# Patient Record
Sex: Male | Born: 1950
Health system: Southern US, Community
[De-identification: ages and names within clinical notes are randomized; demographics above are authoritative.]

## PROBLEM LIST (undated history)

## (undated) DIAGNOSIS — M109 Gout, unspecified: Secondary | ICD-10-CM

## (undated) DIAGNOSIS — M1990 Unspecified osteoarthritis, unspecified site: Secondary | ICD-10-CM

## (undated) DIAGNOSIS — I10 Essential (primary) hypertension: Secondary | ICD-10-CM

## (undated) HISTORY — DX: Unspecified osteoarthritis, unspecified site: M19.90

## (undated) HISTORY — PX: KNEE SURGERY: SHX244

## (undated) HISTORY — DX: Gout, unspecified: M10.9

---

## 2002-04-09 HISTORY — PX: COLONOSCOPY: SHX174

## 2002-10-05 ENCOUNTER — Emergency Department (HOSPITAL_COMMUNITY): Admission: EM | Admit: 2002-10-05 | Discharge: 2002-10-05 | Payer: Self-pay | Admitting: Emergency Medicine

## 2002-10-10 ENCOUNTER — Emergency Department (HOSPITAL_COMMUNITY): Admission: EM | Admit: 2002-10-10 | Discharge: 2002-10-10 | Payer: Self-pay | Admitting: Emergency Medicine

## 2012-04-24 ENCOUNTER — Ambulatory Visit
Admission: RE | Admit: 2012-04-24 | Discharge: 2012-04-24 | Disposition: A | Discharge status: Home / Self Care | Payer: 59 | Source: Ambulatory Visit | Attending: Family Medicine | Admitting: Family Medicine

## 2012-04-24 ENCOUNTER — Ambulatory Visit (INDEPENDENT_AMBULATORY_CARE_PROVIDER_SITE_OTHER): Payer: 59 | Admitting: Sports Medicine

## 2012-04-24 VITALS — BP 120/70 | Ht 71.0 in | Wt 235.0 lb

## 2012-04-24 DIAGNOSIS — M25552 Pain in left hip: Secondary | ICD-10-CM

## 2012-04-24 DIAGNOSIS — M25559 Pain in unspecified hip: Secondary | ICD-10-CM

## 2012-04-24 DIAGNOSIS — S7410XA Injury of femoral nerve at hip and thigh level, unspecified leg, initial encounter: Secondary | ICD-10-CM

## 2012-04-24 MED ORDER — GABAPENTIN 300 MG PO CAPS: 300.0000 mg | ORAL_CAPSULE | Freq: Three times a day (TID) | ORAL | Status: DC

## 2012-04-24 MED ORDER — TRAMADOL HCL 50 MG PO TABS: 50.0000 mg | ORAL_TABLET | Freq: Four times a day (QID) | ORAL | Status: DC | PRN

## 2012-04-24 NOTE — Patient Instructions (Addendum)
X-rays did not show any arthritis  Ultrasound showed meralgia paresthetica  Please start gabapentin 300 at bedtime for 3 days, then take twice daily for 3 days, then increase to 3 times daily  Take vitamin B6 50 mg twice daily  Do hip exercises daily, but decrease intensity if they cause pain  Please follow up in 3 weeks

## 2012-04-24 NOTE — Assessment & Plan Note (Signed)
We will begin him on treatment with gabapentin and bill to 3 times a day.  At the end of 3 weeks I would like to recheck this and repeat the scan of his femoral nerve.  He should hold off on vigorous exercise until we get control of his pain and weakness.  He can use of tramadol to supplement the gabapentin and we'll also strart vitamin B6

## 2012-04-24 NOTE — Progress Notes (Signed)
Timothy Moyer is a 61 y.o. male who presents to [facility] today for 3 months of left groin pain. Gradual onset and worsening. Patient denies any specific injury. However on Monday they were jumping up on steps that were 27 inches high. That night he came down with a lot of knee pain and he had pain in the left hip area. This was just before the left groin pain became progressive.  He has a pertinent history for a left partial knee replacement approximately 2 years ago. Since his rehabilitation from his knee replacement he has been doing Education administrator with a Systems analyst. He is noted in the past 3 months or so he has had worsening left groin pain especially with his exercise. It is worsened to the point now her his activities of daily living are becoming impaired. For example he has difficulty putting his shoe on and getting out of bed and out of a car. His pain is worse to the extent that he is taking old hydrocodone from his knee replacement to sleep at night.  He would like to exercise 4 days a week but he is unable to secondary to his pain. He is worried he may have a muscle strain.  He notes the pain is in his left groin nonradiating and worse with range of motion. He denies any weakness numbness radiating pain fevers or chills.   He was seen by Tyrone Sage for massage to get rid of spasm in his left quadriceps. This helped.  However he periodically has sharp spasms and fasciculations in his left quadriceps and he gets a sharp burning pain there as well.   PMH reviewed. Left partial knee replacement History  Substance Use Topics  . Smoking status: Not on file  . Smokeless tobacco: Not on file  . Alcohol Use: Not on file   ROS as above otherwise neg   Exam:  BP 120/70  Ht 5\' 11"  (1.803 m)  Wt 235 lb (106.595 kg)  BMI 32.78 kg/m2 Gen: Well NAD MSK: Left hip: Normal-appearing nontender.  Range of motion of the left hip is limited to 10 internal rotation 35 external rotation.  His flexion  is 110.  He has pain with resisted flexion adduction, but pain-free abduction.  Left adduction is very weak so that he has trouble holding the leg in without resistance in an adducted position against gravity.  His right hip as a rotational range of motion of 65 and normal flexion range of motion.  No pain or tenderness. Normal strength.   Back: Nontender over spinal midline.   Leg length: No leg length discrepancy  Gait: Mild Trendelenburg gait.   X-rays of the pelvis These reveal only mild arthritic change and the left is minimally different from the right.  Ultrasound of the hip Abductor tendon is intact. Attachment to the symphysis there is some hypoechoic change and edema around the insertion. Hip flexor appears intact Femoral nerve is markedly enlarged and has significant hypoechoic change circling the femoral nerve all noted on transverse scans. On longitudinal scan there is a nodular thickening with partial disruption of the nerve.

## 2012-04-24 NOTE — Assessment & Plan Note (Addendum)
Concerning for osteoarthritis involving the left hip.  So we obtained a two-view hip to assess osteoarthritis.   This was unremarkable so we brought him back for [place]. Additionally put a small lift in his left shoe to allow for a more normal gait.  Tramadol for pain.   Will also have him do some easy exercises to maintain strength around the hip while we treat his nerve injury.

## 2012-04-24 NOTE — Addendum Note (Signed)
Addended by: Jacki Cones C on: 04/24/2012 03:32 PM   Modules accepted: Orders

## 2012-05-15 ENCOUNTER — Ambulatory Visit (INDEPENDENT_AMBULATORY_CARE_PROVIDER_SITE_OTHER): Payer: 59 | Admitting: Sports Medicine

## 2012-05-15 ENCOUNTER — Encounter: Payer: Self-pay | Admitting: Sports Medicine

## 2012-05-15 VITALS — BP 139/105 | HR 90 | Ht 71.0 in | Wt 235.0 lb

## 2012-05-15 DIAGNOSIS — M25559 Pain in unspecified hip: Secondary | ICD-10-CM

## 2012-05-15 DIAGNOSIS — M25552 Pain in left hip: Secondary | ICD-10-CM

## 2012-05-15 DIAGNOSIS — S7410XA Injury of femoral nerve at hip and thigh level, unspecified leg, initial encounter: Secondary | ICD-10-CM

## 2012-05-15 NOTE — Assessment & Plan Note (Signed)
Symptomatic improvement (about 50% compared to 3 weeks ago) and marked improvement of swelling around femoral nerve on ultrasound.  -Start hip strengthening exercises and build over next 2 weeks (see AVS for details) -Be judicious but try activities as tolerated to help with his overall physical deconditioning -Follow-up in 4 weeks

## 2012-05-15 NOTE — Progress Notes (Signed)
  Subjective:    Patient ID: Timothy Moyer, male    DOB: 1950-05-07, 62 y.o.   MRN: [id number]  HPI Follow-up of left groin pain. Diagnosed with left femoral nerve injury 01/16 after he presented initially with 3 months of groin pain.   Since his last visit, he is 50% better. He no longer has sharp shooting pain his groin area. He does have pain with certain flexion movements.  He takes gabapentin 2-3 times a day. He thinks it helps the pain but sometimes makes him feel "foggy".    He continues to have left lateral hip pain that is worse when he lies on that side. It is about the same as it was at his last visit.   Compared to last time he can now lift hip from seated position and can do voluntary adduction - both were painful and weak on last visit  Review of Systems  Allergies, medication, past medical history reviewed.  Smoking status noted.  History of left partial knee replacement    Objective:   Physical Exam GEN: NAD HIP:    ROM: internal rotation 20 deg, external rotation 35 deg (similar on both sides)   Hip flexion: 4-5/5 bilaterally; good passive hip flexion ROM (able to lift knees to chest)   Hip adduction: 4/5 bilaterally   Hip abduction: slightly weaker on left side compared to right, about 4-5/5   Mild-moderate tenderness along left trochanteric bursa  MSK ultrasound left groin: Markedly decreased hypoechoic change circling the femoral nerve.  Persistent nodular thickening on longitudinal scan. Of femoral nerve On transverse scan the nerve is thickened but less hypoechoic change within fibers     Assessment & Plan:

## 2012-05-15 NOTE — Assessment & Plan Note (Signed)
Persistent left lateral hip pain. He may have mild trochanteric bursitis. Recommending icing and hip rolls; NSAIDs as needed but use sparingly. If this worsens, advised he follow-up for trial of glucocorticoid injection.

## 2012-05-15 NOTE — Patient Instructions (Addendum)
Do hip strengthening exercises  Hip adduction/abduction  Standing rotation  In 1-2 weeks if you are feeling stronger try    Step-ups    Crossovers  Continue gabapentin up to 3 times daily   For your hip pain: -Ice -Try hip roller -Advil/ibuprofen as needed -If it gets worse, we may consider injecting it  Follow-up in 4 weeks

## 2012-05-20 ENCOUNTER — Other Ambulatory Visit: Payer: Self-pay | Admitting: Sports Medicine

## 2012-05-26 ENCOUNTER — Telehealth: Payer: Self-pay | Admitting: *Deleted

## 2012-05-26 ENCOUNTER — Other Ambulatory Visit: Payer: Self-pay | Admitting: *Deleted

## 2012-05-26 NOTE — Telephone Encounter (Signed)
Per Dr. Darrick Penna- he would like pt to increase gabapentin to 600 mg qhs and tramadol QID.

## 2012-05-26 NOTE — Telephone Encounter (Signed)
Message copied by Mora Bellman on Mon May 26, 2012 11:40 AM ------      Message from: CERESI, Shawna Orleans L      Created: Mon May 26, 2012  9:34 AM      Regarding: phone message      Contact: [phone]       Pt called regarding hip pain, he would like pain meds sent in due to getting no sleep.  ------

## 2012-05-26 NOTE — Telephone Encounter (Signed)
Left pt a VM to return my call  

## 2012-06-12 ENCOUNTER — Ambulatory Visit: Payer: 59 | Admitting: Sports Medicine

## 2012-06-17 ENCOUNTER — Encounter: Payer: Self-pay | Admitting: Sports Medicine

## 2012-06-17 ENCOUNTER — Ambulatory Visit (INDEPENDENT_AMBULATORY_CARE_PROVIDER_SITE_OTHER): Payer: 59 | Admitting: Sports Medicine

## 2012-06-17 VITALS — BP 145/94 | HR 77 | Ht 71.0 in | Wt 235.0 lb

## 2012-06-17 DIAGNOSIS — S7412XD Injury of femoral nerve at hip and thigh level, left leg, subsequent encounter: Secondary | ICD-10-CM

## 2012-06-17 DIAGNOSIS — Z5189 Encounter for other specified aftercare: Secondary | ICD-10-CM

## 2012-06-17 DIAGNOSIS — M25559 Pain in unspecified hip: Secondary | ICD-10-CM

## 2012-06-17 DIAGNOSIS — M25552 Pain in left hip: Secondary | ICD-10-CM

## 2012-06-17 NOTE — Assessment & Plan Note (Signed)
Pain is much less and seems to be controlled pretty well with using 2-3 tramadol a day

## 2012-06-17 NOTE — Progress Notes (Signed)
Patient ID: Timothy Moyer, male   DOB: 1950/04/23, 62 y.o.   MRN: [id number]  Left fem N injury At least 60% better Able to go up steps Ride stationary bike easily  Worse after sitting awhile Loosens up quickly with stretching and motion  Tramadol 2 to 3 per day  Gabapentin - now at 300/300/ 300 and 600HS  Timothy Moyer is walking better and starting to begin some easy exercises  Physical examination  No acute distress  Hip range of motion is normal but slightly tighter on the left than the right  Hip flexion is strong Hip abduction strong Sartorius testing strong Hip adduction strong but does cause some slight pain  Timothy Moyer is now able to step up on a six-inch step without any instability  Ultrasound examination  The femoral nerve on the left is visualized and the fluid surgical that surrounded this has resolved On longitudinal view the thickened portion of the nerve that looked like a split neuroma is now essentially the same with this the rest of the nerve Overall appearance looks much improved and almost the same as on the right fem N

## 2012-06-17 NOTE — Assessment & Plan Note (Signed)
Nerve injury appears to be healing  We will keep up his gabapentin  Gradually increase his activity level including starting some exercises for hip strength  Recheck in 6 weeks

## 2012-06-17 NOTE — Patient Instructions (Addendum)
Start easy hip exercises  Start easy stationary bike  All the upper body you want with your trunk supported  Isometric abs  Recheck in 6 weeks  Keep medications same or even cut back a little on the gabapentin

## 2012-06-19 ENCOUNTER — Other Ambulatory Visit: Payer: Self-pay | Admitting: Sports Medicine

## 2012-07-18 ENCOUNTER — Other Ambulatory Visit: Payer: Self-pay | Admitting: *Deleted

## 2012-07-18 MED ORDER — TRAMADOL HCL 50 MG PO TABS: 50.0000 mg | ORAL_TABLET | Freq: Four times a day (QID) | ORAL | Status: DC | PRN

## 2012-07-21 ENCOUNTER — Other Ambulatory Visit: Payer: Self-pay | Admitting: *Deleted

## 2012-07-21 MED ORDER — TRAMADOL HCL 50 MG PO TABS: 50.0000 mg | ORAL_TABLET | Freq: Four times a day (QID) | ORAL | Status: DC | PRN

## 2012-07-29 ENCOUNTER — Ambulatory Visit (INDEPENDENT_AMBULATORY_CARE_PROVIDER_SITE_OTHER): Payer: 59 | Admitting: Sports Medicine

## 2012-07-29 VITALS — BP 128/90 | Ht 71.0 in | Wt 230.0 lb

## 2012-07-29 DIAGNOSIS — IMO0002 Reserved for concepts with insufficient information to code with codable children: Secondary | ICD-10-CM

## 2012-07-29 DIAGNOSIS — S7412XD Injury of femoral nerve at hip and thigh level, left leg, subsequent encounter: Secondary | ICD-10-CM

## 2012-07-29 DIAGNOSIS — M1711 Unilateral primary osteoarthritis, right knee: Secondary | ICD-10-CM | POA: Insufficient documentation

## 2012-07-29 DIAGNOSIS — Z5189 Encounter for other specified aftercare: Secondary | ICD-10-CM

## 2012-07-29 DIAGNOSIS — M1710 Unilateral primary osteoarthritis, unspecified knee: Secondary | ICD-10-CM

## 2012-07-29 NOTE — Assessment & Plan Note (Signed)
This is much improved and we will leave him at the nighttime gabapentin and tramadol dose

## 2012-07-29 NOTE — Progress Notes (Signed)
Patient ID: Timothy Moyer, male   DOB: Jul 04, 1950, 62 y.o.   MRN: [id number]  Left groin pain f femoral N injury is much less Still hurts with ER stretch Weaned down gabapentin and pain returned Now takes 2 gabapentin and 2 tramadol just at night and does well Also takes 3 advil in AM  Both knees now hurting No swelling Has partial KR on left by Dr Randall An Now with more pain on RT lateral Xrays showed advanced DJD of knees 3 years ago  Exam  NAD  RT knee has - 5 extension Flexion to 135 Some DJD changes Warmth in SP pouch  LT knee midline scar No effusion or warmth looseness on horizontal motion  Ultrasound RT Knee There is a large effusion in the right suprapatellar pouch and there is none present on the left His right lateral meniscus is thin and calcified with very limited joint space over lateral knee Med Meniscus looks okay Quadriceps tendon and patellar tendon are intact He has mild swelling in the infrapatellar bursa at the tibial tubercle

## 2012-07-29 NOTE — Patient Instructions (Addendum)
Wear compression sleeve for a few hours at a time particularly for standing and walking  Start back 3 days per week or more; Week 1 Recumbent bike 15 minutes and then do the following Straight leg lift Limited bent leg lift Lateral leg lift  Week 2 Recumbent bike up to 15 mins Then exercises Then 10 minutes  Week 3 Recumbent 15 mins Exercises Recumbent 15 mins  Then increase as tolerated  If knee is good - keep up exercises If not consider injection by june

## 2012-07-29 NOTE — Assessment & Plan Note (Signed)
I started him back on an exercise program  We will use a compression sleeve to try to get rid of the swelling  He is a semirecumbent bike  Recheck in about 6 weeks

## 2012-08-12 ENCOUNTER — Other Ambulatory Visit: Payer: Self-pay | Admitting: *Deleted

## 2012-08-12 MED ORDER — GABAPENTIN 300 MG PO CAPS: ORAL_CAPSULE | ORAL | Status: DC

## 2012-08-12 MED ORDER — TRAMADOL HCL 50 MG PO TABS: 50.0000 mg | ORAL_TABLET | Freq: Four times a day (QID) | ORAL | Status: DC | PRN

## 2012-09-23 ENCOUNTER — Ambulatory Visit: Payer: 59 | Admitting: Sports Medicine

## 2012-09-23 ENCOUNTER — Encounter: Payer: Self-pay | Admitting: Sports Medicine

## 2012-09-23 ENCOUNTER — Ambulatory Visit (INDEPENDENT_AMBULATORY_CARE_PROVIDER_SITE_OTHER): Payer: 59 | Admitting: Sports Medicine

## 2012-09-23 VITALS — BP 145/98 | HR 68 | Ht 71.0 in | Wt 230.0 lb

## 2012-09-23 DIAGNOSIS — IMO0002 Reserved for concepts with insufficient information to code with codable children: Secondary | ICD-10-CM

## 2012-09-23 DIAGNOSIS — M1711 Unilateral primary osteoarthritis, right knee: Secondary | ICD-10-CM

## 2012-09-23 DIAGNOSIS — Z5189 Encounter for other specified aftercare: Secondary | ICD-10-CM

## 2012-09-23 DIAGNOSIS — M1710 Unilateral primary osteoarthritis, unspecified knee: Secondary | ICD-10-CM

## 2012-09-23 DIAGNOSIS — S7412XD Injury of femoral nerve at hip and thigh level, left leg, subsequent encounter: Secondary | ICD-10-CM

## 2012-09-23 MED ORDER — DICLOFENAC SODIUM 75 MG PO TBEC: 75.0000 mg | DELAYED_RELEASE_TABLET | Freq: Two times a day (BID) | ORAL | Status: DC

## 2012-09-23 NOTE — Progress Notes (Signed)
Subjective:    Patient ID: Timothy Moyer, male    DOB: 08-07-1950, 62 y.o.   MRN: [id number]  HPI 62 yo male who presents for follow up on left femoral nerve injury and right knee pain.  - left femoral nerve injury: still has occasional pain in groin with certain movements. Pain is sharp shooting. 80% improved since initial injury in January. Denies any numbness, tingling or weakness. He had weaned himself off of the gabapentin, but once off of it, had had some recurrence of pain. He is now taking gabapentin 600mg  at night which helps.   - right knee pain: had been evaluated in April for knee pain thought to be from osteoarthritis.   Pain is located in lateral aspect of knee, it is worst with standing after sitting for a long period of time. Once he is up for a few minutes, he can start walking with less pain. He uses the recumbent bike for exercise without significant pain. Denies any locking or weakness in the knee. He does hear some popping and does notice some swelling.  Has been on tramadol at night, doing strengthening exercises and wearing the compression sleeve with exercise.He states that the knee pain has improved with these measures.  He has a trip to [place] on July 8th and would like to be active during the trip. He wonders if he could get joint injection. He has had them before but never in the right knee.   - trouble sleeping: has been having difficulty sleeping and has been taking over the counter sleep aids which have helped.   Review of Systems Negative except per HPI    Objective:   Physical Exam Filed Vitals:   09/23/12 0934  BP: 145/98  Pulse: 68   General: no acute distress, pleasant gentleman Right knee: suprapatellar joint effusion palpable, tenderness along right lateral aspect of patella, normal knee extension and flexion Normal [place]  Left hip:  5/5 strength with hip flexion but mild discomfort on left with flexion, 5/5 strength with abduction bilaterally,  4+/5 strength with adduction in left hip compared to 5/5 on right.  Discomfort illicited with internal rotation of hip, otherwise normal range of motion with internal and external rotation of the left hip.   Musculoskeletal Ultrasound:  Right knee:  Infrapatellar effusion with calcifications. 6.9 cm effusion in quad Medial meniscus: spurring with calcification Lateral meniscus: degenerative changes with thinning and calcifications.  Spur on Lateral aspect of patella   Left thigh:  Fibrotic changes of femoral nerve, without significiant hypoechoic changes that could previously be seen. 0.97cm in diameter     Assessment & Plan:  62 yo male with left hip pain from a femoral nerve injury, now improving with exercises and gabapentin.  His knee pain appears to be from combination of osteoarthritic changes as well as possible gout. The presence of multiple effusions in knee with calcifications on ultrasound is suspicious for crystal formation.   - Left femoral nerve injury:   continue gabapentin 600mg  at night time.   Take tramadol during the day, as this may be  contributing to patient's difficulty sleeping. Continue  exercises  - right knee pain:   start diclofenac regimen as stated in AVS for gout  continue tramadol  Continue compression sleeve to decrease swelling  and joint effusion  Follow up early July to see how pain is doing and to  see if patient would benefit from a knee steroid  injection, in time for his [place] trip

## 2012-09-23 NOTE — Assessment & Plan Note (Signed)
This is much improved  Minerva smaller and there is much less fluid around the nerve  I suspect he may need to stay on a low dose of gabapentin for several months before we can wean this

## 2012-09-23 NOTE — Assessment & Plan Note (Signed)
We will consider injection on his return visit  Hopefully he will get relief from diclofenac and compression

## 2012-09-23 NOTE — Patient Instructions (Addendum)
For the knee, keep wearing the compression sleeve.  Also start taking the diclofenac that we are sending to the pharmacy.  Take one tablet three times daily for days 1 and 2, then take 1 tablet twice a day per day for 10 days.   Follow up at the beginning of the month of July for evaluation of the shot.

## 2012-10-07 ENCOUNTER — Ambulatory Visit (INDEPENDENT_AMBULATORY_CARE_PROVIDER_SITE_OTHER): Payer: 59 | Admitting: Sports Medicine

## 2012-10-07 VITALS — BP 123/79 | Ht 71.0 in | Wt 230.0 lb

## 2012-10-07 DIAGNOSIS — IMO0002 Reserved for concepts with insufficient information to code with codable children: Secondary | ICD-10-CM

## 2012-10-07 DIAGNOSIS — M1711 Unilateral primary osteoarthritis, right knee: Secondary | ICD-10-CM

## 2012-10-07 DIAGNOSIS — S7412XD Injury of femoral nerve at hip and thigh level, left leg, subsequent encounter: Secondary | ICD-10-CM

## 2012-10-07 DIAGNOSIS — M1710 Unilateral primary osteoarthritis, unspecified knee: Secondary | ICD-10-CM

## 2012-10-07 DIAGNOSIS — Z5189 Encounter for other specified aftercare: Secondary | ICD-10-CM

## 2012-10-07 NOTE — Assessment & Plan Note (Addendum)
Likely mixed with chronic low grade gout. Much improved on diclofenac. Can decrease diclofenac to once daily for at least the next month. Continue compression sleeve.   Follow up in 4-6 weeks.

## 2012-10-07 NOTE — Patient Instructions (Addendum)
Your knee is looking better on the ultrasound. Keep using the compression sleeve. Take the diclofenac once a day for the next month. Have fun in [place]!

## 2012-10-07 NOTE — Progress Notes (Signed)
  Subjective:    Patient ID: Timothy Moyer, male    DOB: 1951-03-22, 62 y.o.   MRN: [id number]  HPI Ms. Sabic is coming today for f/u of left femoral nerve injury and right knee pain.  1. Right knee pain: He reports 90% improvement with taking the diclofenac BID.  When he gets pain, it is on the lateral aspect of his knee.  He was able to complete a reduced intensity cross fit routine yesterday.  He has been using the compression sleeve about 4 days a week.  Has used tramadol once in the last 2 weeks.  He does not desire an injection today.  2. Left femoral neuroma: 85% improved per patient.  Has been off gabapentin for 2 weeks.  Only gets pain with either extreme hip flexion or hip extension.  And the pain is much improved from previous.   Review of Systems     Objective:   Physical Exam BP 123/79  Ht 5\' 11"  (1.803 m)  Wt 230 lb (104.327 kg)  BMI 32.09 kg/m2 Gen: alert, cooperative, NAD Right Knee: Normal to inspection with no erythema or effusion or obvious bony abnormalities. Palpation normal with no warmth, patellar tenderness, or condyle tenderness.  Mild lateral joint line tenderness ROM full in flexion and extension and lower leg rotation. Patellar and quadriceps tendons unremarkable  MSK ultrasound Right knee: mild improvement in infrapatellar fluid collection, calcifications still seen; moderate to large improvement in the suprapatellar fluid collection  Note still some crystalline changes in effusions to suggest gout     Assessment & Plan:  See problem list for specifics.

## 2012-10-07 NOTE — Assessment & Plan Note (Signed)
Improving. Okay to stay off gabapentin. Recommended having gabapentin available in case of flair.

## 2012-11-11 ENCOUNTER — Ambulatory Visit: Payer: 59 | Admitting: Sports Medicine

## 2012-11-25 ENCOUNTER — Other Ambulatory Visit: Payer: Self-pay | Admitting: Sports Medicine

## 2014-03-26 ENCOUNTER — Ambulatory Visit (INDEPENDENT_AMBULATORY_CARE_PROVIDER_SITE_OTHER): Payer: 59 | Admitting: Sports Medicine

## 2014-03-26 ENCOUNTER — Encounter: Payer: Self-pay | Admitting: Sports Medicine

## 2014-03-26 VITALS — BP 134/80 | HR 80 | Ht 71.0 in | Wt 235.0 lb

## 2014-03-26 DIAGNOSIS — M7022 Olecranon bursitis, left elbow: Secondary | ICD-10-CM

## 2014-03-26 DIAGNOSIS — M7020 Olecranon bursitis, unspecified elbow: Secondary | ICD-10-CM | POA: Insufficient documentation

## 2014-03-26 MED ORDER — METHYLPREDNISOLONE ACETATE 40 MG/ML IJ SUSP
40.0000 mg | Freq: Once | INTRAMUSCULAR | Status: AC
  Administered 2014-03-26: 40 mg via INTRA_ARTICULAR

## 2014-03-26 MED ORDER — DICLOFENAC SODIUM 75 MG PO TBEC: 75.0000 mg | DELAYED_RELEASE_TABLET | Freq: Two times a day (BID) | ORAL | Status: DC | PRN

## 2014-03-26 NOTE — Progress Notes (Signed)
   Subjective:    Patient ID: Timothy Moyer, male    DOB: [birth year], 63 y.o.   MRN: [id number]  HPI  Left elbow pain: Patient presents to [facility] with a 4 day history of left elbow sweling. He denies redness or pain. He denies fever, chills,fatigue  nausea or vomit. He is right handed. He has tried to wear a compression sleeve over his elbow for a few days, and feels it made it mildly better. In addition he took diclofenac for 1 day.  He has a history of olecranon bursitis on the right, a few years ago after a trauma to the area. He also has a history of gout. He feels that he possibly was starting a mild gout flare last week in his left foot, but it never fully developed. He does not take any gout medications, other than occasional NSAIDS with flare. He rarely experiences gout flares, he reports about every 3 years. He is not on any blood thinners.   Never smoker  No past medical history on file. No Known Allergies  Review of Systems Per HPI    Objective:   Physical Exam BP 134/80 mmHg  Pulse 80  Ht 5\' 11"  (1.803 m)  Wt 235 lb (106.595 kg)  BMI 32.79 kg/m2 Gen: Pleasant, caucasian male, NAD, non-toxic in appearance. Well developed, well nourished, obese. Alert, oriented x3. MSK: Left elbow: mild erythema and moderate swelling left olecranon, non-tender. Full ROM. Neurovascularly intact distally. Right knee: No erythema, no swelling, no effusions. Non tender to palpitation. +2-3 crepitus.  No joint line tenderness. Full ROM.Neurovascularly intact distally.      Assessment & Plan:  [name] is a 63 y.o. male with left olecranon bursitis and right knee arthritis.  History of gout - Left Olecranon bursa drained today, steroid injected, diclofenac prescribed PRN. Bursa fluid sent for cytology/uric acid. Pt tolerated procedure well. Red flags discussed. - Left elbow compression sohuld be worn over the next week. Sleeve supplied to him today. - Right knee arthritis: Knee compression  with activity, supplied to him today.  - F/U PRN  Procedure Note: Diagnosis: Left olecranon Bursitis Procedure: Drainage left olecranon bursa with steroid injection Consent obtained and verified. Timeout preformed.  Sterile betadine prep. Furthur cleansed with alcohol. Analgesia: Ethyl chloride topical spray, then 3cc 1 % lidocaine Olecranon bursa was then drained with 18g 1.5 in needle, approximately 10cc serosanguinous fluid sent to pathology. 40 mg depo-medrol injected into bursa. Compression bandage applied.  Completed without difficulty Aftercare instructions and Red flags advised.

## 2014-03-26 NOTE — Addendum Note (Signed)
Addended by: Lianne Bushy on: 03/26/2014 02:53 PM   Modules accepted: Orders

## 2014-03-27 LAB — SYNOVIAL CELL COUNT + DIFF, W/ CRYSTALS
Crystals, Fluid: NONE SEEN
Eosinophils-Synovial: 0 % (ref 0–1)
Lymphocytes-Synovial Fld: 36 % — ABNORMAL HIGH (ref 0–20)
Monocyte/Macrophage: 6 % — ABNORMAL LOW (ref 50–90)
Neutrophil, Synovial: 58 % — ABNORMAL HIGH (ref 0–25)
WBC, Synovial: 235 cu mm — ABNORMAL HIGH (ref 0–200)

## 2014-04-05 ENCOUNTER — Telehealth: Payer: Self-pay | Admitting: *Deleted

## 2014-04-05 NOTE — Telephone Encounter (Signed)
Called pt and told him the fluid from his elbow didn't show any signs of gout.  He said he is feeling better and will follow up as needed.

## 2014-04-07 ENCOUNTER — Ambulatory Visit: Payer: 59 | Admitting: Sports Medicine

## 2014-05-21 ENCOUNTER — Encounter: Payer: Self-pay | Admitting: Sports Medicine

## 2014-05-21 ENCOUNTER — Ambulatory Visit (INDEPENDENT_AMBULATORY_CARE_PROVIDER_SITE_OTHER): Payer: 59 | Admitting: Sports Medicine

## 2014-05-21 VITALS — BP 133/93 | Ht 71.0 in | Wt 230.0 lb

## 2014-05-21 DIAGNOSIS — M7022 Olecranon bursitis, left elbow: Secondary | ICD-10-CM

## 2014-05-21 MED ORDER — METHYLPREDNISOLONE ACETATE 40 MG/ML IJ SUSP
40.0000 mg | Freq: Once | INTRAMUSCULAR | Status: AC
  Administered 2014-05-21: 40 mg via INTRA_ARTICULAR

## 2014-05-21 NOTE — Assessment & Plan Note (Signed)
No signs of septic bursitis. After verbal consent was obtained, bursa was aspirated and injected today. -He tolerated the procedure well with relief of pain. -Aftercare instructions were given, including wearing his elbow compression sleeve and icing if needed. -Anti-inflammatories as needed for pain. -If this persists, we did recommend consideration of bursectomy surgery. -If he notices any signs of increasing tenderness, warmth, fever, chills, or spreading rash, we instructed him to call the clinic immediately for reevaluation, as this would be a sign of an acute septic bursitis. He verbalized understanding and agreement. -Follow-up if needed

## 2014-05-21 NOTE — Progress Notes (Signed)
   Subjective:    Patient ID: Timothy Moyer, male    DOB: [birth year], 64 y.o.   MRN: [id number]  HPI Timothy Moyer is a 64 year old male who presents with left posterior elbow swelling. This has been worsening over the past few days. He denies any acute injury. He has a history of olecranon bursitis on the side that had been drained and injected once before. He describes that he had been lifting weights in the gym, and this could've potentially aggravated it. He has a history of olecranon bursectomy on the right side. He denies any fevers, chills, rash, or exquisite tenderness to the area. He is interested in having it drained today. We have discussed surgical option in the past with him.  Past medical history, social history, medications, and allergies were reviewed and are up to date in the chart.  Review of Systems 7 point review of systems was performed and was otherwise negative unless noted in the history of present illness.     Objective:   Physical Exam BP 133/93 mmHg  Ht 5\' 11"  (1.803 m)  Wt 230 lb (104.327 kg)  BMI 32.09 kg/m2 GEN: The patient is well-developed well-nourished male and in no acute distress.  He is awake alert and oriented x3. SKIN: warm and well-perfused, no rash. No lymphadenopathy or tracking. Neuro: Strength 5/5 globally. Sensation intact throughout. No focal deficits. Vasc: +2 bilateral distal pulses. No edema.  MSK: Examination of the left elbow reveals a fussball-sized soft freely mobile smooth swelling of the olecranon bursa. No overlying erythema or induration. Minimal tenderness. Full range of motion of the elbow without pain.  Procedure: Consent was verbally obtained after discussing the risks, benefits, and alternatives with the patient.  Sterile betadine prep. Furthur cleansed with alcohol. Topical analgesic spray: Ethyl chloride. Joint/Bursa: Left olecranon bursa Approached in typical fashion with compression of the bursal sac. An 18-gauge needle on  a 10 mL syringe was used to aspirate 7.5 mL of clear yellow bursal fluid. This was not sent for analysis. Completed without difficulty. Meds: 40 mg methylprednisolone Aftercare instructions and Red flags advised.     Assessment & Plan:  Please see problem based assessment and plan in the problem list.

## 2014-07-16 ENCOUNTER — Ambulatory Visit (INDEPENDENT_AMBULATORY_CARE_PROVIDER_SITE_OTHER): Payer: 59 | Admitting: Family Medicine

## 2014-07-16 ENCOUNTER — Encounter: Payer: Self-pay | Admitting: Family Medicine

## 2014-07-16 VITALS — BP 125/81 | HR 82 | Ht 71.0 in | Wt 230.0 lb

## 2014-07-16 DIAGNOSIS — M7022 Olecranon bursitis, left elbow: Secondary | ICD-10-CM | POA: Diagnosis not present

## 2014-07-16 MED ORDER — METHYLPREDNISOLONE ACETATE 40 MG/ML IJ SUSP
40.0000 mg | Freq: Once | INTRAMUSCULAR | Status: AC
  Administered 2014-07-16: 40 mg via INTRA_ARTICULAR

## 2014-07-16 NOTE — Assessment & Plan Note (Signed)
Left Olecranon bursa drained and injected today. -Considering surgical removal if re-accumulates. -Wear body helix compression for the next week or so. -Discussed signs of septic bursitis, including instructions to call ASAP or seek care if develops any symptoms of possible infxn. -He may call for referral if re-accumulates to facilitate appt with Ortho for surgical removal if needed.

## 2014-07-16 NOTE — Progress Notes (Signed)
Expand All Collapse All     Subjective:    Patient ID: Timothy Moyer, male DOB: 1951/01/28, 64 y.o. MRN: [id number]  HPI Timothy Moyer is a 64 year old male who presents with left posterior elbow swelling. This has been worsening over the past few days after he bumped his elbow. He has a history of left olecranon bursitis on the side that had been drained and injected twice before. He has a history of olecranon bursectomy on the right side. He denies any fevers, chills, rash, or exquisite tenderness to the area. He is interested in having it drained today. We have discussed surgical option in the past with him, which he will consider after today if it re-accumulates. He has found some improvement with wearing a body helix compression sleeve.  Past medical history, social history, medications, and allergies were reviewed and are up to date in the chart.  Review of Systems 7 point review of systems was performed and was otherwise negative unless noted in the history of present illness.     Objective:   Physical Exam BP 125/81 mmHg  Pulse 82  Ht 5\' 11"  (1.803 m)  Wt 230 lb (104.327 kg)  BMI 32.09 kg/m2 GEN: The patient is well-developed well-nourished male and in no acute distress. He is awake alert and oriented x3. SKIN: warm and well-perfused, no rash. No lymphadenopathy or tracking. Neuro: Strength 5/5 globally. Sensation intact throughout. No focal deficits. Vasc: +2 bilateral distal pulses. No edema.  MSK: Examination of the left elbow reveals a fussball-sized soft freely mobile smooth swelling of the olecranon bursa. No surrounding erythema or induration. Minimal tenderness. Full range of motion of the elbow without pain.  Left Olecranon Bursa aspiration/injection Procedure: Consent was verbally obtained after discussing the risks, benefits, and alternatives with the patient.  Sterile betadine prep. Furthur cleansed with alcohol. Topical analgesic spray: Ethyl  chloride. Joint/Bursa: Left olecranon bursa Approached in typical fashion with compression of the bursal sac. An 18-gauge needle on a 10 mL syringe was used to aspirate 10 mL of clear yellow bursal fluid. This was not sent for analysis. Completed without difficulty. Meds: 40 mg methylprednisolone Aftercare instructions and Red flags advised.    Assessment & Plan:  Please see problem based assessment and plan in the problem list.

## 2015-01-13 ENCOUNTER — Encounter: Payer: Self-pay | Admitting: Pulmonary Disease

## 2015-01-13 LAB — PSA: PSA: 0.6

## 2015-03-05 ENCOUNTER — Emergency Department (HOSPITAL_COMMUNITY): Payer: 59

## 2015-03-05 ENCOUNTER — Encounter (HOSPITAL_COMMUNITY): Payer: Self-pay

## 2015-03-05 ENCOUNTER — Emergency Department (HOSPITAL_COMMUNITY)
Admission: EM | Admit: 2015-03-05 | Discharge: 2015-03-05 | Disposition: A | Discharge status: Home / Self Care | Payer: 59 | Attending: Emergency Medicine | Admitting: Emergency Medicine

## 2015-03-05 DIAGNOSIS — S61412A Laceration without foreign body of left hand, initial encounter: Secondary | ICD-10-CM | POA: Diagnosis not present

## 2015-03-05 DIAGNOSIS — IMO0002 Reserved for concepts with insufficient information to code with codable children: Secondary | ICD-10-CM

## 2015-03-05 DIAGNOSIS — Y9389 Activity, other specified: Secondary | ICD-10-CM | POA: Diagnosis not present

## 2015-03-05 DIAGNOSIS — Z23 Encounter for immunization: Secondary | ICD-10-CM | POA: Insufficient documentation

## 2015-03-05 DIAGNOSIS — W208XXA Other cause of strike by thrown, projected or falling object, initial encounter: Secondary | ICD-10-CM | POA: Insufficient documentation

## 2015-03-05 DIAGNOSIS — Y998 Other external cause status: Secondary | ICD-10-CM | POA: Insufficient documentation

## 2015-03-05 DIAGNOSIS — Y92009 Unspecified place in unspecified non-institutional (private) residence as the place of occurrence of the external cause: Secondary | ICD-10-CM | POA: Diagnosis not present

## 2015-03-05 DIAGNOSIS — S61211A Laceration without foreign body of left index finger without damage to nail, initial encounter: Secondary | ICD-10-CM | POA: Diagnosis not present

## 2015-03-05 DIAGNOSIS — I10 Essential (primary) hypertension: Secondary | ICD-10-CM | POA: Diagnosis not present

## 2015-03-05 DIAGNOSIS — S6992XA Unspecified injury of left wrist, hand and finger(s), initial encounter: Secondary | ICD-10-CM | POA: Diagnosis present

## 2015-03-05 DIAGNOSIS — Z79899 Other long term (current) drug therapy: Secondary | ICD-10-CM | POA: Insufficient documentation

## 2015-03-05 HISTORY — DX: Essential (primary) hypertension: I10

## 2015-03-05 MED ORDER — LIDOCAINE HCL 1 % IJ SOLN
30.0000 mL | Freq: Once | INTRAMUSCULAR | Status: AC
  Administered 2015-03-05: 30 mL
  Filled 2015-03-05: qty 40

## 2015-03-05 MED ORDER — BACITRACIN ZINC 500 UNIT/GM EX OINT
TOPICAL_OINTMENT | CUTANEOUS | Status: AC
  Filled 2015-03-05: qty 0.9

## 2015-03-05 MED ORDER — CEPHALEXIN 500 MG PO CAPS: 500.0000 mg | ORAL_CAPSULE | Freq: Two times a day (BID) | ORAL | Status: DC

## 2015-03-05 MED ORDER — TETANUS-DIPHTH-ACELL PERTUSSIS 5-2.5-18.5 LF-MCG/0.5 IM SUSP
0.5000 mL | Freq: Once | INTRAMUSCULAR | Status: AC
  Administered 2015-03-05: 0.5 mL via INTRAMUSCULAR
  Filled 2015-03-05: qty 0.5

## 2015-03-05 NOTE — ED Provider Notes (Signed)
CSN: [id number]     Arrival date & time 03/05/15  1530 History   First MD Initiated Contact with Patient 03/05/15 1651     Chief Complaint  Patient presents with  . Extremity Laceration     (Consider location/radiation/quality/duration/timing/severity/associated sxs/prior Treatment) HPI   Patient is a 64 year old male with past medical history of hypertension who presents to the ED with complaint of left hand laceration, onset PTA. Patient reports he was using a ladder when the ladder collapsed on his hand. Denies fall, head injury or LOC. Endorses pain to his left hand at initial onset however he reports he is not having any pain at this time. Patient was initially seen at an urgent care but was advised to come to the ED for further management due to depth of the laceration. Denies numbness, tingling, weakness. Patient is on any blood thinners. Pt reports his tetanus is not UTD.  Past Medical History  Diagnosis Date  . Hypertension    Past Surgical History  Procedure Laterality Date  . Knee surgery     No family history on file. Social History  Substance Use Topics  . Smoking status: Never Smoker   . Smokeless tobacco: Never Used  . Alcohol Use: Yes     Comment: moderate     Review of Systems  Skin:       laceration  All other systems reviewed and are negative.     Allergies  Review of patient's allergies indicates no known allergies.  Home Medications   Prior to Admission medications   Medication Sig Start Date End Date Taking? Authorizing Provider  CIALIS 20 MG tablet Take 20 mg by mouth daily as needed for erectile dysfunction.  03/04/12  Yes Historical Provider, MD  gabapentin (NEURONTIN) 300 MG capsule TAKE 1 CAPSULE (300 MG TOTAL) BY MOUTH 3 (THREE) TIMES DAILY. Patient taking differently: TAKE 1 CAPSULE (300 MG TOTAL) BY MOUTH 3 (THREE) TIMES DAILY PRN PAIN 11/25/12  Yes Carlos Levering Draper, DO  ibuprofen (ADVIL,MOTRIN) 200 MG tablet Take 400 mg by mouth 2 (two)  times daily as needed for moderate pain.    Yes Historical Provider, MD  lisinopril (PRINIVIL,ZESTRIL) 5 MG tablet Take 5 mg by mouth daily.  02/25/12  Yes Historical Provider, MD  cephALEXin (KEFLEX) 500 MG capsule Take 1 capsule (500 mg total) by mouth 2 (two) times daily. 03/05/15   Chesley Noon Terra Aveni, PA-C   BP 137/99 mmHg  Pulse 61  Temp(Src) 97.8 F (36.6 C) (Oral)  Resp 18  SpO2 96% Physical Exam  Constitutional: He is oriented to person, place, and time. He appears well-developed and well-nourished.  HENT:  Head: Normocephalic and atraumatic.  Eyes: Conjunctivae and EOM are normal. Right eye exhibits no discharge. Left eye exhibits no discharge. No scleral icterus.  Neck: Normal range of motion. Neck supple.  Cardiovascular: Normal rate.   Pulmonary/Chest: Effort normal.  Musculoskeletal: Normal range of motion. He exhibits no edema or tenderness.       Left hand: He exhibits laceration. He exhibits normal range of motion, no tenderness, no bony tenderness, normal capillary refill, no deformity and no swelling. Normal sensation noted. Normal strength noted.       Hands: 5cm linear laceration on left palm, laceration extends into subcutaneous fat and minimal underlying fascia visible, no tendon or bone visible, no active bleeding.   1.5cm superficial laceration/flap on the volar aspect of the left 2nd digit at proximal phalanx, no active bleeding.  FROM of left hand  and digits. 2+ radial pulses. Cap refill <2. Sensation intact. 5/5 strength.   Neurological: He is alert and oriented to person, place, and time.  Skin: Skin is warm and dry.  Nursing note and vitals reviewed.   ED Course  .Marland KitchenLaceration Repair Date/Time: 03/05/2015 8:19 PM Performed by: Nona Dell Authorized by: Nona Dell Consent: Verbal consent obtained. Risks and benefits: risks, benefits and alternatives were discussed Consent given by: patient Patient understanding:  patient states understanding of the procedure being performed Patient identity confirmed: verbally with patient Body area: upper extremity Location details: left hand Laceration length: 5 cm Foreign bodies: no foreign bodies Tendon involvement: none Nerve involvement: none Vascular damage: no Anesthesia: local infiltration Local anesthetic: lidocaine 1% without epinephrine Anesthetic total: 5 ml Preparation: Patient was prepped and draped in the usual sterile fashion. Irrigation solution: saline Irrigation method: syringe Amount of cleaning: standard Skin closure: 3-0 Prolene Number of sutures: 13 Technique: simple Patient tolerance: Patient tolerated the procedure well with no immediate complications  .Marland KitchenLaceration Repair Date/Time: 03/05/2015 8:20 PM Performed by: Nona Dell Authorized by: Nona Dell Consent: Verbal consent obtained. Risks and benefits: risks, benefits and alternatives were discussed Consent given by: patient Patient understanding: patient states understanding of the procedure being performed Patient identity confirmed: verbally with patient Body area: upper extremity Location details: left index finger Laceration length: 1.5 cm Foreign bodies: no foreign bodies Tendon involvement: none Nerve involvement: none Vascular damage: no Anesthesia: local infiltration Local anesthetic: lidocaine 1% with epinephrine Anesthetic total: 2 ml Preparation: Patient was prepped and draped in the usual sterile fashion. Irrigation solution: saline Irrigation method: syringe Skin closure: 5-0 Prolene Number of sutures: 5 Technique: simple Patient tolerance: Patient tolerated the procedure well with no immediate complications   (including critical care time) Labs Review Labs Reviewed - No data to display  Imaging Review Dg Hand Complete Left  03/05/2015  CLINICAL DATA:  64 year old male with laceration of the plantar aspect of the left  hand after a ladder fell onto his hand at home today. EXAM: LEFT HAND - COMPLETE 3+ VIEW COMPARISON:  No priors. FINDINGS: Three views of the left hand demonstrate a lucency overlying the distal aspects of the metacarpals (predominantly the third through fifth), corresponding to the reported laceration. No underlying displaced fracture, subluxation or dislocation is noted. No retained radiopaque foreign body in the soft tissues. IMPRESSION: 1. Soft tissue laceration overlying the distal metacarpals. No underlying bony trauma. Electronically Signed   By: Vinnie Langton M.D.   On: 03/05/2015 18:35   I have personally reviewed and evaluated these images and lab results as part of my medical decision-making.  Filed Vitals:   03/05/15 1731 03/05/15 2036  BP: 147/92 137/99  Pulse: 71 61  Temp: 97.8 F (36.6 C)   Resp: 18 18     MDM   Final diagnoses:  Laceration    Patient presents with laceration to his left hand, no active bleeding. Patient reports a ladder collapsed on his hand. Denies head injury or LOC. Patient is not on any anticoagulation. VSS. Exam revealed 5 cm laceration on the left palmar surface with subcutaneous fat and 1.5cm superficial laceration left 2nd digit with no active bleeding or signs of cellulitis. Pt given Tdap. Hand xray showed no bony trauma. Wound cleaned and irrigated with normal saline, no foreign bodies found, no tendon involvement. Laceration repair performed without any complications. Plan to d/c pt home with keflex. Pt given suture care instructions and advised to  return to the ED in 7 days for suture removal. Pt given resource for Hand surgery for follow up.   Evaluation does not show pathology requring ongoing emergent intervention or admission. Pt is hemodynamically stable and mentating appropriately. Discussed findings/results and plan with patient/guardian, who agrees with plan. All questions answered. Return precautions discussed and outpatient follow up  given.      Chesley Noon Arnold, [place] 03/06/15 0045  Malvin Johns, MD 03/06/15 1359

## 2015-03-05 NOTE — Discharge Instructions (Signed)
Take your medications as prescribed. You may also use ibuprofen as prescribed over-the-counter for pain relief. Keep wound clean with soap and water and dry. Return to the emergency department in 7 days for suture removal. Return to the emergency department if symptoms worsen or new onset of fever, drainage, redness, swelling, tenderness.

## 2015-03-05 NOTE — ED Notes (Addendum)
Pt presents with laceration post ladder closing on left hand; description see wound procedure 1659 03/05/15. Pt reports normal sensation and moderate pulse present on assessment. Ring cutter used per Dillon Bjork to remove ring.

## 2015-03-05 NOTE — ED Notes (Signed)
Elmyra Ricks PA at pt bedside applying sutures

## 2015-03-05 NOTE — ED Notes (Signed)
Lidocaine is at bedside.

## 2015-03-05 NOTE — ED Notes (Signed)
Pt presents with c/o hand laceration. Pt reports he has a deep laceration to the palm of his left hand. Pt was seen at [facility] and sent here because the laceration was too deep and all the way to the bone. UC not able to handle the laceration.

## 2015-06-02 ENCOUNTER — Other Ambulatory Visit: Payer: Self-pay | Admitting: *Deleted

## 2015-06-02 ENCOUNTER — Encounter: Payer: Self-pay | Admitting: Family Medicine

## 2015-06-02 ENCOUNTER — Ambulatory Visit (INDEPENDENT_AMBULATORY_CARE_PROVIDER_SITE_OTHER): Payer: 59 | Admitting: Family Medicine

## 2015-06-02 VITALS — BP 141/90 | HR 60 | Ht 71.0 in | Wt 230.0 lb

## 2015-06-02 DIAGNOSIS — M10071 Idiopathic gout, right ankle and foot: Secondary | ICD-10-CM

## 2015-06-02 DIAGNOSIS — M109 Gout, unspecified: Secondary | ICD-10-CM

## 2015-06-02 MED ORDER — INDOMETHACIN 50 MG PO CAPS: 50.0000 mg | ORAL_CAPSULE | Freq: Three times a day (TID) | ORAL | Status: DC

## 2015-06-02 MED ORDER — METHYLPREDNISOLONE ACETATE 40 MG/ML IJ SUSP
20.0000 mg | Freq: Once | INTRAMUSCULAR | Status: AC
  Administered 2015-06-02: 20 mg via INTRA_ARTICULAR

## 2015-06-02 NOTE — Assessment & Plan Note (Signed)
True podagra today, limited to R MTP  -  Injection  -Indocin 50 mg TID - Would not use PPx therapy due to infrequent attacks - F/U PRN   Aspiration/Injection Procedure Note Timothy Moyer [birth year]  Procedure: Injection Indications: Podagra   Procedure Details Consent: Risks of procedure as well as the alternatives and risks of each were explained to the (patient/caregiver).  Consent for procedure obtained. Time Out: Verified patient identification, verified procedure, site/side was marked, verified correct patient position, special equipment/implants available, medications/allergies/relevent history reviewed, required imaging and test results available.  Performed.  The area was cleaned with iodine and alcohol swabs.    The R 1st MTP was injected using 0.5 cc's of 40mg  Depomedrol and 0.5 cc's of 1% lidocaine with a 21 1 1/2" needle.  Ultrasound was used. Images were obtained in Transverse and Long views showing the injection.    A sterile dressing was applied.  Patient did tolerate procedure well. Estimated blood loss: None

## 2015-06-02 NOTE — Progress Notes (Signed)
  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 male who presents today for R 1st MTP gout.  Gout flare - patient presents today for acute flare of gout in the right first MTP for the past 2-3 days. He has had an attack about every one to 2 years but otherwise is limited. He's had previous attacks in his wrist knee but most commonly the first MTP. Previously has done well with indomethacin has never had an injection. He denies any fevers chills or sweats.  PMHx - Updated and reviewed.  Contributory factors include: gout/HTN PSHx - Updated and reviewed.  Contributory factors include:  Knee arthroscopy FHx - Updated and reviewed.  Contributory factors include:  negative Social Hx - Updated and reviewed. Contributory factors include: nonsmoker  Medications - Neurontin and Cialis as well as lisinopril   ROS Per HPI.  12 point negative other than per HPI.   Exam:  Filed Vitals:   06/02/15 0935  BP: 141/90  Pulse: 60   Gen: NAD, AAO 3 Cardio- RRR Pulm - Normal respiratory effort/rate Skin: No rashes or erythema Extremities: No edema  Vascular: pulses +2 bilateral upper and lower extremity Psych: Normal affect  R foot: + erythema 1st MTP with TTP.  No streaking.  Full ROM and neurovascular intact  Imaging:  + Double contour sign 1st MTP on R

## 2015-06-03 ENCOUNTER — Ambulatory Visit: Payer: 59 | Admitting: Family Medicine

## 2015-12-15 ENCOUNTER — Encounter: Payer: Self-pay | Admitting: Sports Medicine

## 2015-12-15 ENCOUNTER — Ambulatory Visit (INDEPENDENT_AMBULATORY_CARE_PROVIDER_SITE_OTHER): Payer: 59 | Admitting: Sports Medicine

## 2015-12-15 DIAGNOSIS — M1711 Unilateral primary osteoarthritis, right knee: Secondary | ICD-10-CM

## 2015-12-15 MED ORDER — METHYLPREDNISOLONE ACETATE 40 MG/ML IJ SUSP
40.0000 mg | Freq: Once | INTRAMUSCULAR | Status: AC
  Administered 2015-12-15: 40 mg via INTRA_ARTICULAR

## 2015-12-15 MED ORDER — DICLOFENAC SODIUM 75 MG PO TBEC: 75.0000 mg | DELAYED_RELEASE_TABLET | Freq: Two times a day (BID) | ORAL | 1 refills | Status: DC

## 2015-12-16 NOTE — Assessment & Plan Note (Addendum)
Effusion seen on ultrasound, injected right knee.  Follow up as needed.  Can take anti-inflammatories for pain. Recommended compression sleeve if having swelling of the knee.  If not much improvement is seen with the injection, can consider joint or partial joint replacement.

## 2015-12-16 NOTE — Progress Notes (Signed)
  Timothy Moyer - 65 y.o. male MRN [id number]  Date of birth: 1950/12/21  SUBJECTIVE:  Including CC & ROS.  CC: right knee pain  complains of right knee pain that has been ongoing for years but acutely worsened over the past couple weeks. He does not remember doing anything to it. He states that it swells occasionally but does not feel swollen right now. Denies any numbness or tingling. States that he had his left knee partially replaced and was told that he may need a replacement his right one. He complains of buckling. He states that today his pain is not very bad but he is worried that it will become bad again.  He does have a history of gout but it usually occurs in his toes.  ROS: No unexpected weight loss, fever, chills, swelling, instability, muscle pain, numbness/tingling, redness, otherwise see HPI   PMHx - Updated and reviewed.  Contributory factors include: Gout PSHx - Updated and reviewed.  Contributory factors include:  Negative FHx - Updated and reviewed.  Contributory factors include:  Negative Social Hx - Updated and reviewed. Contributory factors include: Negative Medications - reviewed   DATA REVIEWED: Previous office visits  PHYSICAL EXAM:  VS: BP:124/87  HR:63bpm  TEMP: ( )  RESP:   HT:5\' 11"  (180.3 cm)   WT:235 lb (106.6 kg)  BMI:32.8 PHYSICAL EXAM: Gen: NAD, alert, cooperative with exam, well-appearing HEENT: clear conjunctiva,  CV:  no edema, capillary refill brisk, normal rate Resp: non-labored Skin: no rashes, normal turgor  Neuro: no gross deficits.  Psych:  alert and oriented  Knee: Inspection with no erythema, but 2+ effusion palpated. Palpation normal with no warmth, but did have lateral joint line tenderness, no medial joint line tenderness. Did not have patellar tenderness or condyle tenderness. ROM 5 to 100 in extension and flexion. Ligaments with solid consistent endpoints including ACL, PCL, LCL, MCL. Negative Mcmurray's. Non painful patellar  compression. Patellar glide with crepitus. Patellar and quadriceps tendons unremarkable. Hamstring and quadriceps strength is normal.   Ultrasound right knee, limited: Quadriceps tendon viewed a long and short axis with no hypoechoic hyperechoic changes. Suprapatellar pouch revealed a large effusion with small amount of calcifications. Medial meniscus with spurring calcifications. Lateral meniscus had a thin line of hypoechoic change and did have thinning and spurring. Study suggestive of joint effusion with a possible meniscus tear.  ASSESSMENT & PLAN:   Osteoarthritis of right knee Effusion seen on ultrasound, injected right knee.  Follow up as needed.  Can take anti-inflammatories for pain. Recommended compression sleeve if having swelling of the knee.  If not much improvement is seen with the injection, can consider joint or partial joint replacement.  Consent obtained and verified. Sterile betadine prep. Furthur cleansed with alcohol. Topical analgesic spray: Ethyl chloride. Joint: right knee Approached in typical fashion with: lateral approach with ultrasound guidance Completed without difficulty Meds: 1cc depomedrol, 3cc 1% lidocaine Needle: 22G Aftercare instructions and Red flags advised.

## 2015-12-23 ENCOUNTER — Encounter: Payer: Self-pay | Admitting: Gastroenterology

## 2015-12-26 ENCOUNTER — Telehealth: Payer: Self-pay

## 2015-12-26 NOTE — Telephone Encounter (Signed)
Pt called and said he would like to have a colonoscopy. He has been having some constipation and occasionally some bright red blood on tissue.  He is scheduled in [place] for later in November and would like earlier appt.  I told him I will need referral from PCP ( Dr. Luan Pulling).  He said he will have that sent to me and I will give him an office visit them.   Manuela Schwartz, please let me know when his referral arrives.

## 2016-02-16 ENCOUNTER — Telehealth: Payer: Self-pay

## 2016-02-16 NOTE — Telephone Encounter (Signed)
Pt called to get a TCS set up. I told him that we would need a referral first. He has an appointment set up with [facility] but has not seen anyone there since his last TCS( his doctor has retired).

## 2016-02-17 NOTE — Telephone Encounter (Signed)
Please also see note of 12/26/2015.

## 2016-02-29 ENCOUNTER — Ambulatory Visit: Payer: 59 | Admitting: Gastroenterology

## 2016-03-06 ENCOUNTER — Encounter: Payer: Self-pay | Admitting: Internal Medicine

## 2016-03-06 NOTE — Telephone Encounter (Signed)
Referral received from Dr. Luan Pulling.  Sending to Manuela Schwartz to schedule OV for constipation and some bright red blood.

## 2016-03-06 NOTE — Telephone Encounter (Signed)
PATIENT SCHEDULED FOR OFFICE VISIT

## 2016-03-14 ENCOUNTER — Ambulatory Visit (INDEPENDENT_AMBULATORY_CARE_PROVIDER_SITE_OTHER): Payer: 59 | Admitting: Gastroenterology

## 2016-03-14 ENCOUNTER — Encounter: Payer: Self-pay | Admitting: Gastroenterology

## 2016-03-14 ENCOUNTER — Other Ambulatory Visit: Payer: Self-pay

## 2016-03-14 VITALS — BP 123/78 | HR 85 | Temp 97.9°F | Ht 71.0 in | Wt 229.8 lb

## 2016-03-14 DIAGNOSIS — Z86010 Personal history of colonic polyps: Secondary | ICD-10-CM

## 2016-03-14 DIAGNOSIS — K5900 Constipation, unspecified: Secondary | ICD-10-CM

## 2016-03-14 MED ORDER — SOD PICOSULFATE-MAG OX-CIT ACD 10-3.5-12 MG-GM-GM PO PACK: 1.0000 | PACK | ORAL | 0 refills | Status: DC

## 2016-03-14 NOTE — Assessment & Plan Note (Signed)
65 year old male with last colonoscopy in 2004 by [facility], noting to have hyperplastic polyp and diverticulosis. No personal history of adenomas, no family history of polyps or colon cancer. Needs routine updated colonoscopy. As of note, he did have an episode several months ago with bright red blood per rectum in the setting of significant constipation. This was an isolated occurrence. Doubt he has an occult malignancy and was likely a benign anorectal source.   Proceed with TCS with Dr. Gala Romney in near future: the risks, benefits, and alternatives have been discussed with the patient in detail. The patient states understanding and desires to proceed.

## 2016-03-14 NOTE — Progress Notes (Signed)
Primary Care Physician:  Alonza Bogus, MD Primary Gastroenterologist:  Dr. Gala Romney   Chief Complaint  Patient presents with  . Colonoscopy    last TCS 12 years ago  . Rectal Bleeding    one occurrence 2 months ago-was constipation  . Constipation    HPI:   Timothy Moyer is a 65 y.o. male presenting today at the request of Dr. Luan Pulling to pursue a colonoscopy. His last was in 2004 by [facility], with hyperplastic polyp and diverticulosis.   Sits on the toilet for about 10 minutes when having a BM. About 2.5 months ago was significantly impacted, had BM then noted fresh, bright red blood per rectum. Painful with defecation at that time. Since then no signs of bleeding.  Has to strain. Normal stool is medium hard. Since this occurrence will take Miralax maybe twice a week morning and night, which helps through the whole week.   No abdominal pain, N/V. No upper GI symptoms.   Past Medical History:  Diagnosis Date  . Arthritis    knee  . Gout   . Hypertension     Past Surgical History:  Procedure Laterality Date  . COLONOSCOPY  2004   [facility]: 3 mm hepatic flexure polyp, hyperplastic. Diverticulosis   . KNEE SURGERY      Current Outpatient Prescriptions  Medication Sig Dispense Refill  . CIALIS 20 MG tablet Take 20 mg by mouth daily as needed for erectile dysfunction.     . diclofenac (VOLTAREN) 75 MG EC tablet Take 1 tablet (75 mg total) by mouth 2 (two) times daily. 60 tablet 1  . lisinopril (PRINIVIL,ZESTRIL) 5 MG tablet Take 5 mg by mouth daily.     . polyethylene glycol (MIRALAX / GLYCOLAX) packet Take 17 g by mouth daily as needed. Takes twice a week    . Sod Picosulfate-Mag Ox-Cit Acd 10-3.5-12 MG-GM-GM PACK Take 1 Container by mouth as directed. 1 each 0   No current facility-administered medications for this visit.     Allergies as of 03/14/2016  . (No Known Allergies)    Family History  Problem Relation Age of Onset  . Colon cancer Neg Hx   . Colon polyps  Neg Hx     Social History   Social History  . Marital status: Married    Spouse name: N/A  . Number of children: N/A  . Years of education: N/A   Occupational History  .  [employer]   Social History Main Topics  . Smoking status: Never Smoker  . Smokeless tobacco: Never Used  . Alcohol use Yes     Comment: glass of wine a week   . Drug use: No  . Sexual activity: Not on file   Other Topics Concern  . Not on file   Social History Narrative  . No narrative on file    Review of Systems: Gen: Denies any fever, chills, fatigue, weight loss, lack of appetite.  CV: Denies chest pain, heart palpitations, peripheral edema, syncope.  Resp: Denies shortness of breath at rest or with exertion. Denies wheezing or cough.  GI: see HPI  GU : Denies urinary burning, urinary frequency, urinary hesitancy MS: arthritis  Derm: Denies rash, itching, dry skin Psych: Denies depression, anxiety, memory loss, and confusion Heme: Denies bruising, bleeding, and enlarged lymph nodes.  Physical Exam: BP 123/78   Pulse 85   Temp 97.9 F (36.6 C) (Oral)   Ht 5\' 11"  (1.803 m)   Wt 229 lb  12.8 oz (104.2 kg)   BMI 32.05 kg/m  General:   Alert and oriented. Pleasant and cooperative. Well-nourished and well-developed.  Head:  Normocephalic and atraumatic. Eyes:  Without icterus, sclera clear and conjunctiva pink.  Ears:  Normal auditory acuity. Nose:  No deformity, discharge,  or lesions. Mouth:  No deformity or lesions, oral mucosa pink.  Lungs:  Clear to auscultation bilaterally. No wheezes, rales, or rhonchi. No distress.  Heart:  S1, S2 present without murmurs appreciated.  Abdomen:  +BS, soft, non-tender and non-distended. No HSM noted. No guarding or rebound. No masses appreciated.  Rectal:  Deferred  Msk:  Symmetrical without gross deformities. Normal posture. Extremities:  Without edema. Neurologic:  Alert and  oriented x4;  grossly normal neurologically. Psych:  Alert  and cooperative. Normal mood and affect.

## 2016-03-14 NOTE — Patient Instructions (Signed)
You may try Linzess 1 capsule 30 minutes before dinner to see if you like this better than Miralax. If you do, we can send in to your pharmacy.  We have scheduled you for a colonoscopy with Dr. Gala Romney in the near future!

## 2016-03-14 NOTE — Assessment & Plan Note (Signed)
Trial of Linzess 72 mcg once daily. Samples provided. If he likes this, we will send in a prescription.

## 2016-03-14 NOTE — Progress Notes (Signed)
cc'ed to pcp °

## 2016-04-13 ENCOUNTER — Ambulatory Visit (HOSPITAL_COMMUNITY)
Admission: RE | Admit: 2016-04-13 | Discharge: 2016-04-13 | Disposition: A | Discharge status: Home / Self Care | Payer: 59 | Source: Ambulatory Visit | Attending: Internal Medicine | Admitting: Internal Medicine

## 2016-04-13 ENCOUNTER — Encounter (HOSPITAL_COMMUNITY)
Admission: RE | Disposition: A | Discharge status: Home / Self Care | Payer: Self-pay | Source: Ambulatory Visit | Attending: Internal Medicine

## 2016-04-13 ENCOUNTER — Encounter (HOSPITAL_COMMUNITY): Payer: Self-pay | Admitting: *Deleted

## 2016-04-13 DIAGNOSIS — Z1212 Encounter for screening for malignant neoplasm of rectum: Secondary | ICD-10-CM

## 2016-04-13 DIAGNOSIS — D120 Benign neoplasm of cecum: Secondary | ICD-10-CM

## 2016-04-13 DIAGNOSIS — M109 Gout, unspecified: Secondary | ICD-10-CM | POA: Insufficient documentation

## 2016-04-13 DIAGNOSIS — D125 Benign neoplasm of sigmoid colon: Secondary | ICD-10-CM | POA: Diagnosis not present

## 2016-04-13 DIAGNOSIS — M1710 Unilateral primary osteoarthritis, unspecified knee: Secondary | ICD-10-CM | POA: Diagnosis not present

## 2016-04-13 DIAGNOSIS — K5730 Diverticulosis of large intestine without perforation or abscess without bleeding: Secondary | ICD-10-CM | POA: Diagnosis not present

## 2016-04-13 DIAGNOSIS — I10 Essential (primary) hypertension: Secondary | ICD-10-CM | POA: Insufficient documentation

## 2016-04-13 DIAGNOSIS — Z86010 Personal history of colonic polyps: Secondary | ICD-10-CM

## 2016-04-13 DIAGNOSIS — Z1211 Encounter for screening for malignant neoplasm of colon: Secondary | ICD-10-CM | POA: Diagnosis not present

## 2016-04-13 HISTORY — PX: POLYPECTOMY: SHX5525

## 2016-04-13 HISTORY — PX: COLONOSCOPY: SHX5424

## 2016-04-13 LAB — HM COLONOSCOPY

## 2016-04-13 SURGERY — COLONOSCOPY
Anesthesia: Moderate Sedation

## 2016-04-13 MED ORDER — MEPERIDINE HCL 100 MG/ML IJ SOLN
INTRAMUSCULAR | Status: DC | PRN
  Administered 2016-04-13: 50 mg via INTRAVENOUS
  Administered 2016-04-13: 25 mg via INTRAVENOUS
  Administered 2016-04-13: 50 mg via INTRAVENOUS

## 2016-04-13 MED ORDER — SODIUM CHLORIDE 0.9 % IV SOLN
INTRAVENOUS | Status: DC
  Administered 2016-04-13: 10:00:00 via INTRAVENOUS

## 2016-04-13 MED ORDER — ONDANSETRON HCL 4 MG/2ML IJ SOLN
INTRAMUSCULAR | Status: DC | PRN
  Administered 2016-04-13: 4 mg via INTRAVENOUS

## 2016-04-13 MED ORDER — MIDAZOLAM HCL 5 MG/5ML IJ SOLN
INTRAMUSCULAR | Status: DC | PRN
  Administered 2016-04-13: 1 mg via INTRAVENOUS
  Administered 2016-04-13: 2 mg via INTRAVENOUS
  Administered 2016-04-13: 1 mg via INTRAVENOUS
  Administered 2016-04-13: 2 mg via INTRAVENOUS

## 2016-04-13 MED ORDER — MIDAZOLAM HCL 5 MG/5ML IJ SOLN
INTRAMUSCULAR | Status: AC
  Filled 2016-04-13: qty 10

## 2016-04-13 MED ORDER — ONDANSETRON HCL 4 MG/2ML IJ SOLN
INTRAMUSCULAR | Status: AC
  Filled 2016-04-13: qty 2

## 2016-04-13 MED ORDER — SIMETHICONE 40 MG/0.6ML PO SUSP
ORAL | Status: DC | PRN
  Administered 2016-04-13: 2.5 mL

## 2016-04-13 MED ORDER — MEPERIDINE HCL 100 MG/ML IJ SOLN
INTRAMUSCULAR | Status: AC
  Filled 2016-04-13: qty 2

## 2016-04-13 NOTE — H&P (View-Only) (Signed)
Primary Care Physician:  Alonza Bogus, MD Primary Gastroenterologist:  Dr. Gala Romney   Chief Complaint  Patient presents with  . Colonoscopy    last TCS 12 years ago  . Rectal Bleeding    one occurrence 2 months ago-was constipation  . Constipation    HPI:   Timothy Moyer is a 66 y.o. male presenting today at the request of Dr. Luan Pulling to pursue a colonoscopy. His last was in 2004 by [facility], with hyperplastic polyp and diverticulosis.   Sits on the toilet for about 10 minutes when having a BM. About 2.5 months ago was significantly impacted, had BM then noted fresh, bright red blood per rectum. Painful with defecation at that time. Since then no signs of bleeding.  Has to strain. Normal stool is medium hard. Since this occurrence will take Miralax maybe twice a week morning and night, which helps through the whole week.   No abdominal pain, N/V. No upper GI symptoms.   Past Medical History:  Diagnosis Date  . Arthritis    knee  . Gout   . Hypertension     Past Surgical History:  Procedure Laterality Date  . COLONOSCOPY  2004   LBGI: 3 mm hepatic flexure polyp, hyperplastic. Diverticulosis   . KNEE SURGERY      Current Outpatient Prescriptions  Medication Sig Dispense Refill  . CIALIS 20 MG tablet Take 20 mg by mouth daily as needed for erectile dysfunction.     . diclofenac (VOLTAREN) 75 MG EC tablet Take 1 tablet (75 mg total) by mouth 2 (two) times daily. 60 tablet 1  . lisinopril (PRINIVIL,ZESTRIL) 5 MG tablet Take 5 mg by mouth daily.     . polyethylene glycol (MIRALAX / GLYCOLAX) packet Take 17 g by mouth daily as needed. Takes twice a week    . Sod Picosulfate-Mag Ox-Cit Acd 10-3.5-12 MG-GM-GM PACK Take 1 Container by mouth as directed. 1 each 0   No current facility-administered medications for this visit.     Allergies as of 03/14/2016  . (No Known Allergies)    Family History  Problem Relation Age of Onset  . Colon cancer Neg Hx   . Colon polyps  Neg Hx     Social History   Social History  . Marital status: Married    Spouse name: N/A  . Number of children: N/A  . Years of education: N/A   Occupational History  .  [employer]   Social History Main Topics  . Smoking status: Never Smoker  . Smokeless tobacco: Never Used  . Alcohol use Yes     Comment: glass of wine a week   . Drug use: No  . Sexual activity: Not on file   Other Topics Concern  . Not on file   Social History Narrative  . No narrative on file    Review of Systems: Gen: Denies any fever, chills, fatigue, weight loss, lack of appetite.  CV: Denies chest pain, heart palpitations, peripheral edema, syncope.  Resp: Denies shortness of breath at rest or with exertion. Denies wheezing or cough.  GI: see HPI  GU : Denies urinary burning, urinary frequency, urinary hesitancy MS: arthritis  Derm: Denies rash, itching, dry skin Psych: Denies depression, anxiety, memory loss, and confusion Heme: Denies bruising, bleeding, and enlarged lymph nodes.  Physical Exam: BP 123/78   Pulse 85   Temp 97.9 F (36.6 C) (Oral)   Ht 5\' 11"  (1.803 m)   Wt 229 lb  12.8 oz (104.2 kg)   BMI 32.05 kg/m  General:   Alert and oriented. Pleasant and cooperative. Well-nourished and well-developed.  Head:  Normocephalic and atraumatic. Eyes:  Without icterus, sclera clear and conjunctiva pink.  Ears:  Normal auditory acuity. Nose:  No deformity, discharge,  or lesions. Mouth:  No deformity or lesions, oral mucosa pink.  Lungs:  Clear to auscultation bilaterally. No wheezes, rales, or rhonchi. No distress.  Heart:  S1, S2 present without murmurs appreciated.  Abdomen:  +BS, soft, non-tender and non-distended. No HSM noted. No guarding or rebound. No masses appreciated.  Rectal:  Deferred  Msk:  Symmetrical without gross deformities. Normal posture. Extremities:  Without edema. Neurologic:  Alert and  oriented x4;  grossly normal neurologically. Psych:  Alert  and cooperative. Normal mood and affect.

## 2016-04-13 NOTE — Interval H&P Note (Signed)
History and Physical Interval Note:  04/13/2016 10:12 AM  Timothy Moyer  has presented today for surgery, with the diagnosis of HISTORY OF POLYPS  The various methods of treatment have been discussed with the patient and family. After consideration of risks, benefits and other options for treatment, the patient has consented to  Procedure(s) with comments: COLONOSCOPY (N/A) - 1030-moved to 1045 per office  as a surgical intervention .  The patient's history has been reviewed, patient examined, no change in status, stable for surgery.  I have reviewed the patient's chart and labs.  Questions were answered to the patient's satisfaction.     Timothy Moyer  No change. Colonoscopy today per plan.  The risks, benefits, limitations, alternatives and imponderables have been reviewed with the patient. Questions have been answered. All parties are agreeable.

## 2016-04-13 NOTE — Op Note (Signed)
[facility] Patient Name: Timothy Moyer Procedure Date: 04/13/2016 9:56 AM MRN: [id number] Date of Birth: [birth year] Attending MD: Norvel Richards , MD CSN: [id number] Age: 66 Admit Type: Outpatient Procedure:                Colonoscopy with multiple snare polypectomies Indications:              Screening for colorectal malignant neoplasm Providers:                Norvel Richards, MD, Lurline Del, RN, Purcell Nails.                            [place], Merchant navy officer, Aram Candela Referring MD:              Medicines:                Midazolam 6 mg IV, Meperidine 125 mg IV,                            Ondansetron 4 mg IV Complications:            No immediate complications. Estimated Blood Loss:     Estimated blood loss was minimal. Procedure:                Pre-Anesthesia Assessment:                           - Prior to the procedure, a History and Physical                            was performed, and patient medications and                            allergies were reviewed. The patient's tolerance of                            previous anesthesia was also reviewed. The risks                            and benefits of the procedure and the sedation                            options and risks were discussed with the patient.                            All questions were answered, and informed consent                            was obtained. Prior Anticoagulants: The patient has                            taken no previous anticoagulant or antiplatelet                            agents. ASA Grade Assessment: II - A patient with  mild systemic disease. After reviewing the risks                            and benefits, the patient was deemed in                            satisfactory condition to undergo the procedure.                           After obtaining informed consent, the colonoscope                            was passed under direct vision.  Throughout the                            procedure, the patient's blood pressure, pulse, and                            oxygen saturations were monitored continuously. The                            EC-[id number]) scope was introduced through                            the anus and advanced to the the cecum, identified                            by appendiceal orifice and ileocecal valve. The                            ileocecal valve, appendiceal orifice, and rectum                            were photographed. The entire colon was well                            visualized. The quality of the bowel preparation                            was adequate. Scope In: 10:26:03 AM Scope Out: 10:52:29 AM Scope Withdrawal Time: 0 hours 17 minutes 32 seconds  Total Procedure Duration: 0 hours 26 minutes 26 seconds  Findings:      The perianal and digital rectal examinations were normal.      (2) sessile polyps were found in the sigmoid colon and cecum. The polyps       were 5 mm in size. These polyps were removed with a cold snare.       Resection and retrieval were complete. Estimated blood loss was minimal.      A 7 mm polyp was found in the sigmoid colon. The polyp was sessile. The       polyp was removed with a hot snare. Resection and retrieval were       complete. Estimated blood loss: none.      The exam was otherwise without abnormality on direct and retroflexion  views.      A few diverticula were found in the entire colon. Impression:               - Multiple polyps - removed as described above..                           - The examination was otherwise normal on direct                            and retroflexion views.                           - Diverticulosis in the entire examined colon. Moderate Sedation:      Moderate (conscious) sedation was administered by the endoscopy nurse       and supervised by the endoscopist. The following parameters were       monitored:  oxygen saturation, heart rate, blood pressure, respiratory       rate, EKG, adequacy of pulmonary ventilation, and response to care.       Total physician intraservice time was 34 minutes. Recommendation:           - Patient has a contact number available for                            emergencies. The signs and symptoms of potential                            delayed complications were discussed with the                            patient. Return to normal activities tomorrow.                            Written discharge instructions were provided to the                            patient.                           - Resume previous diet.                           - Continue present medications.                           - Repeat colonoscopy date to be determined after                            pending pathology results are reviewed for                            surveillance based on pathology results. Begin                            Benefiber 1 tablespoon twice daily.                           -  Return to GI office in 3 months. Procedure Code(s):        --- Professional ---                           [phone], Colonoscopy, flexible; with removal of                            tumor(s), polyp(s), or other lesion(s) by snare                            technique                           99152, Moderate sedation services provided by the                            same physician or other qualified health care                            professional performing the diagnostic or                            therapeutic service that the sedation supports,                            requiring the presence of an independent trained                            observer to assist in the monitoring of the                            patient's level of consciousness and physiological                            status; initial 15 minutes of intraservice time,                            patient age 43 years  or older                           [phone], Moderate sedation services; each additional                            15 minutes intraservice time Diagnosis Code(s):        --- Professional ---                           Z12.11, Encounter for screening for malignant                            neoplasm of colon                           D12.5, Benign neoplasm of sigmoid colon  D12.0, Benign neoplasm of cecum                           K57.30, Diverticulosis of large intestine without                            perforation or abscess without bleeding CPT copyright 2016 American Medical Association. All rights reserved. The codes documented in this report are preliminary and upon coder review may  be revised to meet current compliance requirements. Cristopher Estimable. Kahla Risdon, MD Norvel Richards, MD 04/13/2016 11:05:05 AM This report has been signed electronically. Number of Addenda: 0

## 2016-04-13 NOTE — Discharge Instructions (Addendum)
Colonoscopy Discharge Instructions  Read the instructions outlined below and refer to this sheet in the next few weeks. These discharge instructions provide you with general information on caring for yourself after you leave the hospital. Your doctor may also give you specific instructions. While your treatment has been planned according to the most current medical practices available, unavoidable complications occasionally occur. If you have any problems or questions after discharge, call Dr. Gala Romney at [phone]. ACTIVITY  You may resume your regular activity, but move at a slower pace for the next 24 hours.   Take frequent rest periods for the next 24 hours.   Walking will help get rid of the air and reduce the bloated feeling in your belly (abdomen).   No driving for 24 hours (because of the medicine (anesthesia) used during the test).    Do not sign any important legal documents or operate any machinery for 24 hours (because of the anesthesia used during the test).  NUTRITION  Drink plenty of fluids.   You may resume your normal diet as instructed by your doctor.   Begin with a light meal and progress to your normal diet. Heavy or fried foods are harder to digest and may make you feel sick to your stomach (nauseated).   Avoid alcoholic beverages for 24 hours or as instructed.  MEDICATIONS  You may resume your normal medications unless your doctor tells you otherwise.  WHAT YOU CAN EXPECT TODAY  Some feelings of bloating in the abdomen.   Passage of more gas than usual.   Spotting of blood in your stool or on the toilet paper.  IF YOU HAD POLYPS REMOVED DURING THE COLONOSCOPY:  No aspirin products for 7 days or as instructed.   No alcohol for 7 days or as instructed.   Eat a soft diet for the next 24 hours.  FINDING OUT THE RESULTS OF YOUR TEST Not all test results are available during your visit. If your test results are not back during the visit, make an appointment  with your caregiver to find out the results. Do not assume everything is normal if you have not heard from your caregiver or the medical facility. It is important for you to follow up on all of your test results.  SEEK IMMEDIATE MEDICAL ATTENTION IF:  You have more than a spotting of blood in your stool.   Your belly is swollen (abdominal distention).   You are nauseated or vomiting.   You have a temperature over 101.   You have abdominal pain or discomfort that is severe or gets worse throughout the day.     Colon diverticulosis and polyp information provided  Begin Benefiber 1 tablespoon twice daily  Office visit with [place] in 3 months  Further recommendations to follow pending review of pathology report   Diverticulosis Diverticulosis is the condition that develops when small pouches (diverticula) form in the wall of your colon. Your colon, or large intestine, is where water is absorbed and stool is formed. The pouches form when the inside layer of your colon pushes through weak spots in the outer layers of your colon. CAUSES  No one knows exactly what causes diverticulosis. RISK FACTORS  Being older than 8. Your risk for this condition increases with age. Diverticulosis is rare in people younger than 40 years. By age 68, almost everyone has it.  Eating a low-fiber diet.  Being frequently constipated.  Being overweight.  Not getting enough exercise.  Smoking.  Taking over-the-counter pain  medicines, like aspirin and ibuprofen. SYMPTOMS  Most people with diverticulosis do not have symptoms. DIAGNOSIS  Because diverticulosis often has no symptoms, health care providers often discover the condition during an exam for other colon problems. In many cases, a health care provider will diagnose diverticulosis while using a flexible scope to examine the colon (colonoscopy). TREATMENT  If you have never developed an infection related to diverticulosis, you may not need  treatment. If you have had an infection before, treatment may include:  Eating more fruits, vegetables, and grains.  Taking a fiber supplement.  Taking a live bacteria supplement (probiotic).  Taking medicine to relax your colon. HOME CARE INSTRUCTIONS   Drink at least 6-8 glasses of water each day to prevent constipation.  Try not to strain when you have a bowel movement.  Keep all follow-up appointments. If you have had an infection before:  Increase the fiber in your diet as directed by your health care provider or dietitian.  Take a dietary fiber supplement if your health care provider approves.  Only take medicines as directed by your health care provider. SEEK MEDICAL CARE IF:   You have abdominal pain.  You have bloating.  You have cramps.  You have not gone to the bathroom in 3 days. SEEK IMMEDIATE MEDICAL CARE IF:   Your pain gets worse.  Yourbloating becomes very bad.  You have a fever or chills, and your symptoms suddenly get worse.  You begin vomiting.  You have bowel movements that are bloody or black. MAKE SURE YOU:  Understand these instructions.  Will watch your condition.  Will get help right away if you are not doing well or get worse. This information is not intended to replace advice given to you by your health care provider. Make sure you discuss any questions you have with your health care provider. Document Released: 12/22/2003 Document Revised: 03/31/2013 Document Reviewed: 02/18/2013 Elsevier Interactive Patient Education  2017 [employer].

## 2016-04-16 ENCOUNTER — Encounter (HOSPITAL_COMMUNITY): Payer: Self-pay | Admitting: Internal Medicine

## 2016-04-21 ENCOUNTER — Encounter: Payer: Self-pay | Admitting: Internal Medicine

## 2016-05-30 ENCOUNTER — Encounter: Payer: Self-pay | Admitting: Internal Medicine

## 2016-07-11 ENCOUNTER — Ambulatory Visit: Payer: 59 | Admitting: Gastroenterology

## 2016-07-11 ENCOUNTER — Telehealth: Payer: Self-pay | Admitting: Gastroenterology

## 2016-07-11 ENCOUNTER — Encounter: Payer: Self-pay | Admitting: Gastroenterology

## 2016-07-11 NOTE — Telephone Encounter (Signed)
PT WAS A NO SHOW AND LETTER SENT  °

## 2016-08-08 ENCOUNTER — Ambulatory Visit (INDEPENDENT_AMBULATORY_CARE_PROVIDER_SITE_OTHER): Payer: 59 | Admitting: Gastroenterology

## 2016-08-08 ENCOUNTER — Encounter: Payer: Self-pay | Admitting: Gastroenterology

## 2016-08-08 VITALS — BP 114/68 | HR 69 | Temp 97.6°F | Ht 71.0 in | Wt 217.2 lb

## 2016-08-08 DIAGNOSIS — K5900 Constipation, unspecified: Secondary | ICD-10-CM

## 2016-08-08 NOTE — Progress Notes (Signed)
Referring Provider: Sinda Du, MD Primary Care Physician:  Alonza Bogus, MD Primary GI: Dr. Gala Romney   Chief Complaint  Patient presents with  . Constipation    HPI:   Timothy Moyer is a 66 y.o. male presenting today with a history of multiple adenomas on last colonoscopy in Jan 2018, due for surveillance in 5 years. Last seen Dec 2017 with constipation and prescribed Linzess 72 mcg. Here for follow-up of constipation.   Took a few samples of Linzess but that's it. Might have helped somewhat. Will use glycerin suppository at times and sometimes does ok without anything. No rectal bleeding.   Past Medical History:  Diagnosis Date  . Arthritis    knee  . Gout   . Hypertension     Past Surgical History:  Procedure Laterality Date  . COLONOSCOPY  2004   LBGI: 3 mm hepatic flexure polyp, hyperplastic. Diverticulosis   . COLONOSCOPY N/A 04/13/2016   Dr. Gala Romney: multiple tubular adenomas, colonic diverticulosis  . KNEE SURGERY    . POLYPECTOMY  04/13/2016   Procedure: POLYPECTOMY;  Surgeon: Daneil Dolin, MD;  Location: [facility];  Service: Endoscopy;;  sigmoidpolypectomies x2; cecal polypectomiees;    Current Outpatient Prescriptions  Medication Sig Dispense Refill  . CIALIS 20 MG tablet Take 20 mg by mouth daily as needed for erectile dysfunction.     . diclofenac (VOLTAREN) 75 MG EC tablet Take 1 tablet (75 mg total) by mouth 2 (two) times daily. (Patient taking differently: Take 75 mg by mouth daily as needed for moderate pain. ) 60 tablet 1  . ibuprofen (ADVIL,MOTRIN) 200 MG tablet Take 400-600 mg by mouth daily as needed for headache or moderate pain.    Marland Kitchen lisinopril (PRINIVIL,ZESTRIL) 5 MG tablet Take 5 mg by mouth daily.     . Melatonin 3 MG CAPS Take 3 mg by mouth daily as needed (sleep).    Vladimir Faster Glycol-Propyl Glycol (SYSTANE OP) Apply 1 drop to eye daily as needed (dry eyes).    . polyethylene glycol (MIRALAX / GLYCOLAX) packet Take 17 g by mouth  daily as needed for mild constipation.     No current facility-administered medications for this visit.     Allergies as of 08/08/2016  . (No Known Allergies)    Family History  Problem Relation Age of Onset  . Colon cancer Neg Hx   . Colon polyps Neg Hx     Social History   Social History  . Marital status: Married    Spouse name: N/A  . Number of children: N/A  . Years of education: N/A   Occupational History  .  [employer]   Social History Main Topics  . Smoking status: Never Smoker  . Smokeless tobacco: Never Used  . Alcohol use Yes     Comment: glass of wine a week   . Drug use: No  . Sexual activity: Not Asked   Other Topics Concern  . None   Social History Narrative  . None    Review of Systems: As mentioned in HPI   Physical Exam: BP 114/68   Pulse 69   Temp 97.6 F (36.4 C) (Oral)   Ht 5\' 11"  (1.803 m)   Wt 217 lb 3.2 oz (98.5 kg)   BMI 30.29 kg/m  General:   Alert and oriented. No distress noted. Pleasant and cooperative.  Head:  Normocephalic and atraumatic. Eyes:  Conjuctiva clear without scleral icterus. Mouth:  Oral mucosa  pink and moist. Good dentition. No lesions. Abdomen:  +BS, soft, non-tender and non-distended. No rebound or guarding. No HSM or masses noted. Msk:  Symmetrical without gross deformities. Normal posture. Extremities:  Without edema. Neurologic:  Alert and  oriented x4;  grossly normal neurologically. Psych:  Alert and cooperative. Normal mood and affect.

## 2016-08-08 NOTE — Assessment & Plan Note (Signed)
Will trial Linzess 72 mcg once daily again. If this works well, will send in prescription. Next colonoscopy in 5 years. No alarm symptoms.

## 2016-08-08 NOTE — Progress Notes (Signed)
CC'ED TO PCP 

## 2016-08-08 NOTE — Patient Instructions (Signed)
Let's try the Linzess 72 micrograms each morning, 30 minutes before breakfast. This may cause loose stool at first but should get better after a few days. If not, call me. We can always increase the dosage if needed. Let me know how it works, and I will send in a prescription.  Your next colonoscopy is in 5 years.  Have a great week!

## 2017-03-11 DIAGNOSIS — L570 Actinic keratosis: Secondary | ICD-10-CM | POA: Diagnosis not present

## 2017-03-11 DIAGNOSIS — D225 Melanocytic nevi of trunk: Secondary | ICD-10-CM | POA: Diagnosis not present

## 2017-03-11 DIAGNOSIS — L821 Other seborrheic keratosis: Secondary | ICD-10-CM | POA: Diagnosis not present

## 2017-03-11 DIAGNOSIS — D1801 Hemangioma of skin and subcutaneous tissue: Secondary | ICD-10-CM | POA: Diagnosis not present

## 2017-05-29 DIAGNOSIS — E785 Hyperlipidemia, unspecified: Secondary | ICD-10-CM | POA: Diagnosis not present

## 2017-05-29 DIAGNOSIS — Z125 Encounter for screening for malignant neoplasm of prostate: Secondary | ICD-10-CM | POA: Diagnosis not present

## 2017-05-29 DIAGNOSIS — R739 Hyperglycemia, unspecified: Secondary | ICD-10-CM | POA: Diagnosis not present

## 2017-05-29 DIAGNOSIS — Z0000 Encounter for general adult medical examination without abnormal findings: Secondary | ICD-10-CM | POA: Diagnosis not present

## 2017-05-29 LAB — HEPATIC FUNCTION PANEL
ALT: 21 (ref 10–40)
AST: 18 (ref 14–40)
Alkaline Phosphatase: 61 (ref 25–125)
Bilirubin, Total: 0.6

## 2017-05-29 LAB — BASIC METABOLIC PANEL
BUN: 19 (ref 4–21)
CO2: 27 — AB (ref 13–22)
Chloride: 103 (ref 99–108)
Creatinine: 1.1 (ref <=1.3)
Glucose: 173
Potassium: 5.4 — AB (ref 3.4–5.3)
Sodium: 139 (ref 137–147)

## 2017-05-29 LAB — CBC AND DIFFERENTIAL
HCT: 47 (ref 41–53)
Hemoglobin: 16.4 (ref 13.5–17.5)
Neutrophils Absolute: 3467
Platelets: 210 (ref 150–399)
WBC: 5.4

## 2017-05-29 LAB — LIPID PANEL
Cholesterol: 183 (ref 0–200)
HDL: 45 (ref 35–70)
LDL Cholesterol: 113
Triglycerides: 135 (ref 40–160)

## 2017-05-29 LAB — CBC: RBC: 5.12 — AB (ref 3.87–5.11)

## 2017-05-29 LAB — COMPREHENSIVE METABOLIC PANEL
Albumin: 4.1 (ref 3.5–5.0)
Calcium: 9.5 (ref 8.7–10.7)
GFR calc Af Amer: 80
GFR calc non Af Amer: 69
Globulin: 2.4

## 2017-05-29 LAB — TSH: TSH: 3.08 (ref <=5.90)

## 2017-05-29 LAB — HEMOGLOBIN A1C: Hemoglobin A1C: 6.2

## 2017-06-10 DIAGNOSIS — L821 Other seborrheic keratosis: Secondary | ICD-10-CM | POA: Diagnosis not present

## 2017-06-10 DIAGNOSIS — D1801 Hemangioma of skin and subcutaneous tissue: Secondary | ICD-10-CM | POA: Diagnosis not present

## 2017-06-10 DIAGNOSIS — D692 Other nonthrombocytopenic purpura: Secondary | ICD-10-CM | POA: Diagnosis not present

## 2017-06-10 DIAGNOSIS — L570 Actinic keratosis: Secondary | ICD-10-CM | POA: Diagnosis not present

## 2017-10-16 DIAGNOSIS — L821 Other seborrheic keratosis: Secondary | ICD-10-CM | POA: Diagnosis not present

## 2017-10-16 DIAGNOSIS — D485 Neoplasm of uncertain behavior of skin: Secondary | ICD-10-CM | POA: Diagnosis not present

## 2017-11-19 ENCOUNTER — Encounter: Payer: Self-pay | Admitting: Sports Medicine

## 2017-11-19 ENCOUNTER — Ambulatory Visit: Payer: 59 | Admitting: Sports Medicine

## 2017-11-19 ENCOUNTER — Encounter

## 2017-11-19 DIAGNOSIS — M1711 Unilateral primary osteoarthritis, right knee: Secondary | ICD-10-CM

## 2017-11-19 MED ORDER — METHYLPREDNISOLONE ACETATE 40 MG/ML IJ SUSP
40.0000 mg | Freq: Once | INTRAMUSCULAR | Status: AC
  Administered 2017-11-19: 40 mg via INTRA_ARTICULAR

## 2017-11-19 MED ORDER — DICLOFENAC SODIUM 75 MG PO TBEC: 75.0000 mg | DELAYED_RELEASE_TABLET | Freq: Two times a day (BID) | ORAL | 1 refills | Status: DC

## 2017-11-19 NOTE — Progress Notes (Signed)
   Subjective:    Patient ID: Timothy Moyer, male    DOB: 03-Dec-1950, 67 y.o.   MRN: [id number]  HPI  67 yo male with hx of L unicompartmental knee arthroplasty here for f/u regarding chronic right knee pain. Last seen in clinic for this on 12/15/15. Had intra-articular steroid injection at that time. Had improvement for about 3-4 months. Now pain worsening over the past year. Affecting his ability to remain active. States he has gained approx 15 lbs because of this. Unable to ride stationary bike which was previously his exercise of choice. States that he will develop significant swelling over the course of the day. Denies any warmth or redness. Takes ibuprofen or diclofenac on occasion with mild improvement. Denies any catching or locking.    Past HX Gout: has responded well to diclofenac at hgih dose for flares  Review of Systems per HPI No locking No giving way    Objective:   Physical Exam BP (!) 130/94   Ht 5\' 11"  (1.803 m)   Wt 230 lb (104.3 kg)   BMI 32.08 kg/m   General: well-appearing, NAD MSK: Right knee - Mild effusion. No warmth or erythema. Tenderness noted along medial and lateral joint lines. No pain along patellar facets or with grind. ROM limited to 110 deg of flexion and unable to achieve full extension by 15 deg. Crepitus noted with AROM. Strength 5/5. Negative Mcmurray. Negative varus/valgus laxity.  MSK Ultrasound - Significant effusion noted within suprapatellar pouch and along medial/lateral joint line. Lateral meniscus with likely degenerative tear.      Assessment & Plan:    Chronic right knee pain 2/2 likely OA. No prior XR but exam and ultrasound consistent with this diagnosis. Discussed management options and patient agreeable to steroid injection (see below). Will give rx for PO diclofenac to use as needed with flares. May need ortho referral to discuss replacement if symptoms continue to progress.   Procedure note - right knee steroid injection Informed  consent obtained and signed. Right knee prepped in sterile fashion. Ultrasound used prior to and during injection using lateral approach. Needle visualized within joint space and injected using 1 cc DepoMedrol, 4 cc 1% lidocaine. No bleeding. Patient tolerated well. Discussed aftercare and return precautions.  Janace Hoard, PGY-3 I observed and examined the patient with the resident and agree with assessment and plan.  Note reviewed and modified by me. Stefanie Libel, MD

## 2017-11-19 NOTE — Assessment & Plan Note (Signed)
This is progressive OA  CSI today  See Dr Raphael Gibney for opinion about TKR - motion getting too limited  OK to use diclofenac for pain

## 2017-11-20 NOTE — Patient Instructions (Signed)
Dr [facility] 12/11/17 at [place] [address] [phone]

## 2017-12-04 DIAGNOSIS — K08431 Partial loss of teeth due to caries, class I: Secondary | ICD-10-CM | POA: Diagnosis not present

## 2017-12-04 DIAGNOSIS — K0825 Moderate atrophy of the maxilla: Secondary | ICD-10-CM | POA: Diagnosis not present

## 2017-12-11 DIAGNOSIS — M1711 Unilateral primary osteoarthritis, right knee: Secondary | ICD-10-CM | POA: Diagnosis not present

## 2018-02-05 DIAGNOSIS — M1711 Unilateral primary osteoarthritis, right knee: Secondary | ICD-10-CM | POA: Diagnosis not present

## 2018-02-13 DIAGNOSIS — M1711 Unilateral primary osteoarthritis, right knee: Secondary | ICD-10-CM | POA: Diagnosis not present

## 2018-02-20 DIAGNOSIS — M1711 Unilateral primary osteoarthritis, right knee: Secondary | ICD-10-CM | POA: Diagnosis not present

## 2018-04-17 DIAGNOSIS — M1711 Unilateral primary osteoarthritis, right knee: Secondary | ICD-10-CM | POA: Diagnosis not present

## 2018-04-17 DIAGNOSIS — M25561 Pain in right knee: Secondary | ICD-10-CM | POA: Diagnosis not present

## 2018-04-22 DIAGNOSIS — D225 Melanocytic nevi of trunk: Secondary | ICD-10-CM | POA: Diagnosis not present

## 2018-04-22 DIAGNOSIS — L570 Actinic keratosis: Secondary | ICD-10-CM | POA: Diagnosis not present

## 2018-04-22 DIAGNOSIS — M1711 Unilateral primary osteoarthritis, right knee: Secondary | ICD-10-CM | POA: Diagnosis not present

## 2018-04-22 DIAGNOSIS — L821 Other seborrheic keratosis: Secondary | ICD-10-CM | POA: Diagnosis not present

## 2018-04-22 DIAGNOSIS — L918 Other hypertrophic disorders of the skin: Secondary | ICD-10-CM | POA: Diagnosis not present

## 2018-04-24 ENCOUNTER — Other Ambulatory Visit: Payer: Self-pay

## 2018-04-24 MED ORDER — DICLOFENAC SODIUM 75 MG PO TBEC: 75.0000 mg | DELAYED_RELEASE_TABLET | Freq: Two times a day (BID) | ORAL | 1 refills | Status: DC

## 2018-07-01 DIAGNOSIS — M25562 Pain in left knee: Secondary | ICD-10-CM | POA: Diagnosis not present

## 2018-07-01 DIAGNOSIS — M170 Bilateral primary osteoarthritis of knee: Secondary | ICD-10-CM | POA: Diagnosis not present

## 2018-08-04 ENCOUNTER — Other Ambulatory Visit: Payer: Self-pay | Admitting: Sports Medicine

## 2018-08-28 DIAGNOSIS — M1711 Unilateral primary osteoarthritis, right knee: Secondary | ICD-10-CM | POA: Diagnosis not present

## 2018-10-10 ENCOUNTER — Other Ambulatory Visit: Payer: Self-pay | Admitting: Sports Medicine

## 2019-01-20 ENCOUNTER — Ambulatory Visit
Admission: EM | Admit: 2019-01-20 | Discharge: 2019-01-20 | Disposition: A | Discharge status: Home / Self Care | Payer: 59

## 2019-01-20 ENCOUNTER — Ambulatory Visit (INDEPENDENT_AMBULATORY_CARE_PROVIDER_SITE_OTHER): Payer: 59

## 2019-01-20 ENCOUNTER — Other Ambulatory Visit: Payer: Self-pay

## 2019-01-20 DIAGNOSIS — M25461 Effusion, right knee: Secondary | ICD-10-CM

## 2019-01-20 DIAGNOSIS — S81011A Laceration without foreign body, right knee, initial encounter: Secondary | ICD-10-CM | POA: Diagnosis not present

## 2019-01-20 DIAGNOSIS — W19XXXA Unspecified fall, initial encounter: Secondary | ICD-10-CM | POA: Diagnosis not present

## 2019-01-20 DIAGNOSIS — S80211A Abrasion, right knee, initial encounter: Secondary | ICD-10-CM | POA: Diagnosis not present

## 2019-01-20 DIAGNOSIS — M25561 Pain in right knee: Secondary | ICD-10-CM

## 2019-01-20 MED ORDER — MUPIROCIN 2 % EX OINT: 1.0000 "application " | TOPICAL_OINTMENT | Freq: Two times a day (BID) | CUTANEOUS | 2 refills | Status: AC
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
## 2019-01-20 NOTE — Discharge Instructions (Addendum)
Three sutures applied Bandage applied Ace bandage applied.  Keep knee immobilized for the next 24-48 hours Keep covered for next and dry for next 24-48 hours.  After then you may gently clean with warm water and mild soap.  Avoid submerging wound in water. Change dressing daily and apply a thin layer of bactroban.  Return in 10 days to have sutures removed.   Take OTC ibuprofen or tylenol as needed for pain releif Return sooner or go to the ED if you have any new or worsening symptoms such as increased pain, redness, swelling, drainage, discharge, decreased range of motion of extremity, etc..

## 2019-01-20 NOTE — ED Provider Notes (Signed)
[facility]   [id number] 01/20/19 Arrival Time: [id number]  CC: LACERATION  SUBJECTIVE:  Timothy Moyer is a 68 y.o. male who presents with a laceration that occurred a couple of hours ago.  Symptoms began after fell and scraped his RT knee on the pavement while riding his bike.  Took a shower prior to arrival.  Bleeding controlled.  Currently not on blood thinners.  Denies similar symptoms in the past.  Denies fever, chills, nausea, vomiting, redness, swelling, purulent drainage, decrease strength or sensation.   Td UTD: Yes. Updated 3 years ago  ROS: As per HPI.  All other pertinent ROS negative.     Past Medical History:  Diagnosis Date  . Arthritis    knee  . Gout   . Hypertension    Past Surgical History:  Procedure Laterality Date  . COLONOSCOPY  2004   LBGI: 3 mm hepatic flexure polyp, hyperplastic. Diverticulosis   . COLONOSCOPY N/A 04/13/2016   Dr. Gala Romney: multiple tubular adenomas, colonic diverticulosis  . KNEE SURGERY    . POLYPECTOMY  04/13/2016   Procedure: POLYPECTOMY;  Surgeon: Daneil Dolin, MD;  Location: [facility];  Service: Endoscopy;;  sigmoidpolypectomies x2; cecal polypectomiees;   No Known Allergies No current facility-administered medications on file prior to encounter.    Current Outpatient Medications on File Prior to Encounter  Medication Sig Dispense Refill  . tadalafil (CIALIS) 20 MG tablet Take 20 mg by mouth daily as needed.    . diclofenac (VOLTAREN) 75 MG EC tablet TAKE 1 TABLET BY MOUTH TWICE A DAY 60 tablet 1  . ibuprofen (ADVIL,MOTRIN) 200 MG tablet Take 400-600 mg by mouth daily as needed for headache or moderate pain.    Marland Kitchen lisinopril (PRINIVIL,ZESTRIL) 5 MG tablet Take 5 mg by mouth daily.     . Melatonin 3 MG CAPS Take 3 mg by mouth daily as needed (sleep).    Vladimir Faster Glycol-Propyl Glycol (SYSTANE OP) Apply 1 drop to eye daily as needed (dry eyes).    . polyethylene glycol (MIRALAX / GLYCOLAX) packet Take 17 g by mouth daily  as needed for mild constipation.     Social History   Socioeconomic History  . Marital status: Married    Spouse name: Not on file  . Number of children: Not on file  . Years of education: Not on file  . Highest education level: Not on file  Occupational History    Employer: [employer]  Social Needs  . Financial resource strain: Not on file  . Food insecurity    Worry: Not on file    Inability: Not on file  . Transportation needs    Medical: Not on file    Non-medical: Not on file  Tobacco Use  . Smoking status: Never Smoker  . Smokeless tobacco: Never Used  Substance and Sexual Activity  . Alcohol use: Yes    Comment: glass of wine a week   . Drug use: No  . Sexual activity: Not on file  Lifestyle  . Physical activity    Days per week: Not on file    Minutes per session: Not on file  . Stress: Not on file  Relationships  . Social Herbalist on phone: Not on file    Gets together: Not on file    Attends religious service: Not on file    Active member of club or organization: Not on file    Attends meetings of clubs or organizations:  Not on file    Relationship status: Not on file  . Intimate partner violence    Fear of current or ex partner: Not on file    Emotionally abused: Not on file    Physically abused: Not on file    Forced sexual activity: Not on file  Other Topics Concern  . Not on file  Social History Narrative  . Not on file   Family History  Problem Relation Age of Onset  . Colon cancer Neg Hx   . Colon polyps Neg Hx      OBJECTIVE:  Vitals:   01/20/19 1411  BP: (!) 134/92  Pulse: 79  Resp: 18  Temp: 98.5 F (36.9 C)  SpO2: 97%     General appearance: alert; no distress Head: NCAT Lungs: Normal respiratory effort CV: Dorsalis pedis pulse 2+ Skin: abrasion RT anterior knee, approximately 10 cm in diameter, TTP; laceration of RT anterior knee; size: approx 1-2 cm in length RT Knee: Strength and sensation  intact Psychological: alert and cooperative; normal mood and affect      Procedure: Verbal consent obtained. Patient provided with risks and alternatives to the procedure. Wound copiously irrigated with NS then cleansed with betadine. Anesthetized with 6 mL of lidocaine with epinephrine. Wound carefully explored. No foreign body, tendon injury, or nonviable tissue were noted. Using sterile technique 3 interrupted 4-0 Prolene sutures were placed to reapproximate the wound. Patient tolerated procedure well. No complications. Minimal bleeding. Patient advised to look for and return for any signs of infection such as redness, swelling, discharge, or worsening pain. Return for suture removal in 10 days.  DIAGNOSTIC STUDIES:  Dg Knee Complete 4 Views Right  Result Date: 01/20/2019 CLINICAL DATA:  Right knee pain EXAM: RIGHT KNEE - COMPLETE 4+ VIEW COMPARISON:  None. FINDINGS: Generalized osteopenia. No acute fracture or dislocation. No aggressive osseous lesion. Moderate lateral femorotibial compartment with marginal osteophytes. Tiny medial femorotibial compartment marginal osteophytes. Moderate patellofemoral compartment joint space narrowing with marginal osteophytes. Large joint effusion. No soft tissue abnormality. IMPRESSION: 1.  No acute osseous injury of the right knee. 2. Tricompartmental osteoarthritis of the right knee most severe in the lateral femorotibial compartment. Electronically Signed   By: Kathreen Devoid   On: 01/20/2019 14:29    My Interpretation: X-rays negative for bony abnormalities including fracture, or dislocation.  No soft tissue swelling.    I have reviewed the x-rays myself and the radiologist interpretation. I am in agreement with the radiologist interpretation.     ASSESSMENT & PLAN:  1. Abrasion of right knee, initial encounter   2. Laceration of right knee, initial encounter   3. Fall, initial encounter     Meds ordered this encounter  Medications  . mupirocin  ointment (BACTROBAN) 2 %    Sig: Apply 1 application topically 2 (two) times daily.    Dispense:  30 g    Refill:  2    Order Specific Question:   Supervising Provider    Answer:   Raylene Everts [id number]   Three sutures applied Bandage applied Ace bandage applied.  Keep knee immobilized for the next 24-48 hours Keep covered for next and dry for next 24-48 hours.  After then you may gently clean with warm water and mild soap.  Avoid submerging wound in water. Change dressing daily and apply a thin layer of bactroban.  Return in 10 days to have sutures removed.   Take OTC ibuprofen or tylenol as needed for pain releif  Return sooner or go to the ED if you have any new or worsening symptoms such as increased pain, redness, swelling, drainage, discharge, decreased range of motion of extremity, etc..    Reviewed expectations re: course of current medical issues. Questions answered. Outlined signs and symptoms indicating need for more acute intervention. Patient verbalized understanding. After Visit Summary given.   Lestine Box, PA-C 01/20/19 1512

## 2019-02-05 ENCOUNTER — Ambulatory Visit
Admission: EM | Admit: 2019-02-05 | Discharge: 2019-02-05 | Disposition: A | Discharge status: Home / Self Care | Payer: 59

## 2019-02-05 DIAGNOSIS — Z4802 Encounter for removal of sutures: Secondary | ICD-10-CM

## 2019-02-05 DIAGNOSIS — S81011D Laceration without foreign body, right knee, subsequent encounter: Secondary | ICD-10-CM | POA: Diagnosis not present

## 2019-02-05 NOTE — ED Triage Notes (Signed)
Pt here for suture removal, 3 sutures removed, skin is approximated well. Knee is still sore . Healing WNL

## 2019-05-09 ENCOUNTER — Other Ambulatory Visit: Payer: Self-pay

## 2019-05-09 ENCOUNTER — Ambulatory Visit
Admission: EM | Admit: 2019-05-09 | Discharge: 2019-05-09 | Disposition: A | Discharge status: Home / Self Care | Payer: 59 | Attending: Emergency Medicine | Admitting: Emergency Medicine

## 2019-05-09 DIAGNOSIS — Z760 Encounter for issue of repeat prescription: Secondary | ICD-10-CM

## 2019-05-09 DIAGNOSIS — I10 Essential (primary) hypertension: Secondary | ICD-10-CM

## 2019-05-09 MED ORDER — LISINOPRIL 5 MG PO TABS: 5.0000 mg | ORAL_TABLET | Freq: Every day | ORAL | 0 refills | Status: AC

## 2019-05-09 NOTE — ED Provider Notes (Signed)
[facility]    CSN: [id number] Arrival date & time: 05/09/19  1028      History   Chief Complaint Chief Complaint  Patient presents with  . Medication Refill    HPI Timothy Moyer is a 69 y.o. male.   Timothy Moyer 69 years old male presented to the urgent care for complaint of medication refill for hypertension.  Patient reported his PCP retired and he is looking for a new provider.  However his medication ran out and will need to get it refil.  He stated he is taking lisinopril 5 mg daily.  He denies headache, confusion, chest pain, shortness of breath, nausea, vomiting and diarrhea,  The history is provided by the patient. No language interpreter was used.    Past Medical History:  Diagnosis Date  . Arthritis    knee  . Gout   . Hypertension     Patient Active Problem List   Diagnosis Date Noted  . Constipation 03/14/2016  . History of colonic polyps 03/14/2016  . Gout flare 06/02/2015  . Olecranon bursitis 03/26/2014  . Osteoarthritis of right knee 07/29/2012  . Left hip pain 04/24/2012  . Femoral nerve injury 04/24/2012    Past Surgical History:  Procedure Laterality Date  . COLONOSCOPY  2004   LBGI: 3 mm hepatic flexure polyp, hyperplastic. Diverticulosis   . COLONOSCOPY N/A 04/13/2016   Dr. Gala Romney: multiple tubular adenomas, colonic diverticulosis  . KNEE SURGERY    . POLYPECTOMY  04/13/2016   Procedure: POLYPECTOMY;  Surgeon: Daneil Dolin, MD;  Location: [facility];  Service: Endoscopy;;  sigmoidpolypectomies x2; cecal polypectomiees;       Home Medications    Prior to Admission medications   Medication Sig Start Date End Date Taking? Authorizing Provider  diclofenac (VOLTAREN) 75 MG EC tablet TAKE 1 TABLET BY MOUTH TWICE A DAY 10/14/18   Stefanie Libel, MD  ibuprofen (ADVIL,MOTRIN) 200 MG tablet Take 400-600 mg by mouth daily as needed for headache or moderate pain.    [provider]  lisinopril (ZESTRIL) 5 MG tablet Take 1  tablet (5 mg total) by mouth daily. 05/09/19   Harini Dearmond, Darrelyn Hillock, FNP  Melatonin 3 MG CAPS Take 3 mg by mouth daily as needed (sleep).    [provider]  mupirocin ointment (BACTROBAN) 2 % Apply 1 application topically 2 (two) times daily. 01/20/19   Wurst, [place], PA-C  Polyethyl Glycol-Propyl Glycol (SYSTANE OP) Apply 1 drop to eye daily as needed (dry eyes).    [provider]  polyethylene glycol (MIRALAX / GLYCOLAX) packet Take 17 g by mouth daily as needed for mild constipation.    [provider]  tadalafil (CIALIS) 20 MG tablet Take 20 mg by mouth daily as needed. 09/10/15   [provider]    Family History Family History  Problem Relation Age of Onset  . Healthy Mother   . Healthy Father   . Colon cancer Neg Hx   . Colon polyps Neg Hx     Social History Social History   Tobacco Use  . Smoking status: Never Smoker  . Smokeless tobacco: Never Used  Substance Use Topics  . Alcohol use: Yes    Comment: glass of wine a week   . Drug use: No     Allergies   Patient has no known allergies.   Review of Systems Review of Systems  Constitutional: Negative.   Respiratory: Negative.   Cardiovascular: Negative.   Neurological: Negative.  All other systems reviewed and are negative.    Physical Exam Triage Vital Signs ED Triage Vitals  Enc Vitals Group     BP 05/09/19 1037 (!) 134/91     Pulse Rate 05/09/19 1037 66     Resp 05/09/19 1037 16     Temp 05/09/19 1037 (!) 97.4 F (36.3 C)     Temp Source 05/09/19 1037 Oral     SpO2 05/09/19 1037 99 %     Weight --      Height --      Head Circumference --      Peak Flow --      Pain Score 05/09/19 1041 0     Pain Loc --      Pain Edu? --      Excl. in [place]? --    No data found.  Updated Vital Signs BP (!) 134/91 (BP Location: Right Arm)   Pulse 66   Temp (!) 97.4 F (36.3 C) (Oral)   Resp 16   SpO2 99%   Visual Acuity Right Eye Distance:   Left Eye Distance:     Bilateral Distance:    Right Eye Near:   Left Eye Near:    Bilateral Near:     Physical Exam Vitals and nursing note reviewed.  Constitutional:      General: He is not in acute distress.    Appearance: Normal appearance. He is normal weight. He is not ill-appearing or toxic-appearing.  HENT:     Head: Normocephalic.     Right Ear: Tympanic membrane, ear canal and external ear normal. There is no impacted cerumen.     Left Ear: Tympanic membrane, ear canal and external ear normal. There is no impacted cerumen.     Nose: Nose normal. No congestion.     Mouth/Throat:     Mouth: Mucous membranes are moist.     Pharynx: Oropharynx is clear.  Cardiovascular:     Rate and Rhythm: Normal rate and regular rhythm.     Pulses: Normal pulses.     Heart sounds: Normal heart sounds. No murmur.  Pulmonary:     Effort: Pulmonary effort is normal. No respiratory distress.     Breath sounds: Normal breath sounds. No wheezing or rhonchi.  Chest:     Chest wall: No tenderness.  Skin:    Capillary Refill: Capillary refill takes less than 2 seconds.  Neurological:     General: No focal deficit present.     Mental Status: He is alert and oriented to person, place, and time. Mental status is at baseline.     Cranial Nerves: No cranial nerve deficit.     Sensory: No sensory deficit.     Motor: No weakness.     Coordination: Coordination normal.      UC Treatments / Results  Labs (all labs ordered are listed, but only abnormal results are displayed) Labs Reviewed  CBC  COMPREHENSIVE METABOLIC PANEL    EKG   Radiology No results found.  Procedures Procedures (including critical care time)  Medications Ordered in UC Medications - No data to display  Initial Impression / Assessment and Plan / UC Course  I have reviewed the triage vital signs and the nursing notes.  Pertinent labs & imaging results that were available during my care of the patient were reviewed by me and  considered in my medical decision making (see chart for details).   CBC and CMP lab were ordered We will refill  lisinopril Advised patient to follow-up with primary care for next refill To return for worsening symptoms  Final Clinical Impressions(s) / UC Diagnoses   Final diagnoses:  Essential hypertension  Medication refill     Discharge Instructions     90 days of lisinopril refill Take medication as prescribed Exercise daily  Advised for low-salt diet To return for worsening of symptoms    ED Prescriptions    Medication Sig Dispense Auth. Provider   lisinopril (ZESTRIL) 5 MG tablet Take 1 tablet (5 mg total) by mouth daily. 90 tablet Mayra Jolliffe, Darrelyn Hillock, FNP     PDMP not reviewed this encounter.   Emerson Monte, Herman 05/09/19 1114

## 2019-05-09 NOTE — Discharge Instructions (Addendum)
90 days of lisinopril refill Take medication as prescribed Exercise daily  Advised for low-salt diet To return for worsening of symptoms

## 2019-05-09 NOTE — ED Triage Notes (Signed)
Pt presents to UC for medication refill for lisinopril 5mg . Pt states he has a few doses left, but his PCP retired and is unable to get a refill.

## 2019-05-10 LAB — CBC
Hematocrit: 44.1 % (ref 37.5–51.0)
Hemoglobin: 15.2 g/dL (ref 13.0–17.7)
MCH: 30.7 pg (ref 26.6–33.0)
MCHC: 34.5 g/dL (ref 31.5–35.7)
MCV: 89 fL (ref 79–97)
Platelets: 229 10*3/uL (ref 150–450)
RBC: 4.95 x10E6/uL (ref 4.14–5.80)
RDW: 12.8 % (ref 11.6–15.4)
WBC: 5.6 10*3/uL (ref 3.4–10.8)

## 2019-05-10 LAB — COMPREHENSIVE METABOLIC PANEL
ALT: 41 IU/L (ref 0–44)
AST: 35 IU/L (ref 0–40)
Albumin/Globulin Ratio: 1.8 (ref 1.2–2.2)
Albumin: 4.3 g/dL (ref 3.8–4.8)
Alkaline Phosphatase: 59 IU/L (ref 39–117)
BUN/Creatinine Ratio: 14 (ref 10–24)
BUN: 16 mg/dL (ref 8–27)
Bilirubin Total: 0.5 mg/dL (ref 0.0–1.2)
CO2: 21 mmol/L (ref 20–29)
Calcium: 9.8 mg/dL (ref 8.6–10.2)
Chloride: 99 mmol/L (ref 96–106)
Creatinine, Ser: 1.15 mg/dL (ref 0.76–1.27)
GFR calc Af Amer: 74 mL/min/{1.73_m2} (ref >59)
GFR calc non Af Amer: 64 mL/min/{1.73_m2} (ref >59)
Globulin, Total: 2.4 g/dL (ref 1.5–4.5)
Glucose: 198 mg/dL — ABNORMAL HIGH (ref 65–99)
Potassium: 4.9 mmol/L (ref 3.5–5.2)
Sodium: 137 mmol/L (ref 134–144)
Total Protein: 6.7 g/dL (ref 6.0–8.5)

## 2019-07-06 ENCOUNTER — Ambulatory Visit: Payer: 59 | Attending: Internal Medicine

## 2019-07-06 DIAGNOSIS — Z23 Encounter for immunization: Secondary | ICD-10-CM

## 2019-07-06 NOTE — Progress Notes (Signed)
   Covid-19 Vaccination Clinic  Name:  Keyonn Kubick    MRN: [id number] DOB: May 30, 1950  07/06/2019  Mr. Lovins was observed post Covid-19 immunization for 15 minutes without incident. He was provided with Vaccine Information Sheet and instruction to access the V-Safe system.   Mr. Fanta was instructed to call 911 with any severe reactions post vaccine: Marland Kitchen Difficulty breathing  . Swelling of face and throat  . A fast heartbeat  . A bad rash all over body  . Dizziness and weakness   Immunizations Administered    Name Date Dose VIS Date Route   JANSSEN COVID-19 VACCINE 07/06/2019  1:51 PM 0.5 mL 06/06/2019 Intramuscular   Manufacturer: [employer]   Lot: [id number]   [place]: [phone]

## 2019-08-31 ENCOUNTER — Encounter (INDEPENDENT_AMBULATORY_CARE_PROVIDER_SITE_OTHER): Payer: Self-pay | Admitting: Otolaryngology

## 2019-08-31 ENCOUNTER — Other Ambulatory Visit: Payer: Self-pay

## 2019-08-31 ENCOUNTER — Ambulatory Visit (INDEPENDENT_AMBULATORY_CARE_PROVIDER_SITE_OTHER): Payer: 59 | Admitting: Otolaryngology

## 2019-08-31 VITALS — Temp 97.3°F

## 2019-08-31 DIAGNOSIS — H9313 Tinnitus, bilateral: Secondary | ICD-10-CM

## 2019-08-31 DIAGNOSIS — H903 Sensorineural hearing loss, bilateral: Secondary | ICD-10-CM

## 2019-08-31 NOTE — Progress Notes (Signed)
HPI: Timothy Moyer is a 69 y.o. male who presents for evaluation of ringing in his ears that he has had for couple years but has seen worse lately.  Denies loud noise exposure although he does hunt occasionally and uses ear protection.  Past Medical History:  Diagnosis Date  . Arthritis    knee  . Gout   . Hypertension    Past Surgical History:  Procedure Laterality Date  . COLONOSCOPY  2004   LBGI: 3 mm hepatic flexure polyp, hyperplastic. Diverticulosis   . COLONOSCOPY N/A 04/13/2016   Dr. Gala Romney: multiple tubular adenomas, colonic diverticulosis  . KNEE SURGERY    . POLYPECTOMY  04/13/2016   Procedure: POLYPECTOMY;  Surgeon: Daneil Dolin, MD;  Location: [facility];  Service: Endoscopy;;  sigmoidpolypectomies x2; cecal polypectomiees;   Social History   Socioeconomic History  . Marital status: Married    Spouse name: Not on file  . Number of children: Not on file  . Years of education: Not on file  . Highest education level: Not on file  Occupational History    Employer: [employer]  Tobacco Use  . Smoking status: Never Smoker  . Smokeless tobacco: Never Used  Substance and Sexual Activity  . Alcohol use: Yes    Comment: glass of wine a week   . Drug use: No  . Sexual activity: Not on file  Other Topics Concern  . Not on file  Social History Narrative  . Not on file   Social Determinants of Health   Financial Resource Strain:   . Difficulty of Paying Living Expenses:   Food Insecurity:   . Worried About Charity fundraiser in the Last Year:   . Arboriculturist in the Last Year:   Transportation Needs:   . Film/video editor (Medical):   Marland Kitchen Lack of Transportation (Non-Medical):   Physical Activity:   . Days of Exercise per Week:   . Minutes of Exercise per Session:   Stress:   . Feeling of Stress :   Social Connections:   . Frequency of Communication with Friends and Family:   . Frequency of Social Gatherings with Friends and Family:   .  Attends Religious Services:   . Active Member of Clubs or Organizations:   . Attends Archivist Meetings:   Marland Kitchen Marital Status:    Family History  Problem Relation Age of Onset  . Healthy Mother   . Healthy Father   . Colon cancer Neg Hx   . Colon polyps Neg Hx    No Known Allergies Prior to Admission medications   Medication Sig Start Date End Date Taking? Authorizing Provider  diclofenac (VOLTAREN) 75 MG EC tablet TAKE 1 TABLET BY MOUTH TWICE A DAY 10/14/18  Yes Stefanie Libel, MD  ibuprofen (ADVIL,MOTRIN) 200 MG tablet Take 400-600 mg by mouth daily as needed for headache or moderate pain.   Yes [provider]  lisinopril (ZESTRIL) 5 MG tablet Take 1 tablet (5 mg total) by mouth daily. 05/09/19  Yes Avegno, Darrelyn Hillock, FNP  Melatonin 3 MG CAPS Take 3 mg by mouth daily as needed (sleep).   Yes [provider]  mupirocin ointment (BACTROBAN) 2 % Apply 1 application topically 2 (two) times daily. 01/20/19  Yes Wurst, [place], PA-C  Polyethyl Glycol-Propyl Glycol (SYSTANE OP) Apply 1 drop to eye daily as needed (dry eyes).   Yes [provider]  polyethylene glycol (MIRALAX / GLYCOLAX) packet Take 17  g by mouth daily as needed for mild constipation.   Yes [provider]  tadalafil (CIALIS) 20 MG tablet Take 20 mg by mouth daily as needed. 09/10/15  Yes [provider]     Positive ROS: Otherwise negative  All other systems have been reviewed and were otherwise negative with the exception of those mentioned in the HPI and as above.  Physical Exam: Constitutional: Alert, well-appearing, no acute distress Ears: External ears without lesions or tenderness. Ear canals are clear bilaterally with intact, clear TMs bilaterally..  Nasal: External nose without lesions. Septum midline. Clear nasal passages Oral: Lips and gums without lesions. Tongue and palate mucosa without lesions. Posterior oropharynx clear. Neck: No palpable adenopathy or  masses Respiratory: Breathing comfortably  Skin: No facial/neck lesions or rash noted.  Audiogram was performed in the office today and this demonstrated symmetric hearing in both ears with downsloping mild to moderate SNHL.  SRT's were 30 dB bilaterally.  He had type A tympanograms bilaterally.  Procedures  Assessment: Tinnitus secondary to downsloping SNHL in both ears.  Plan: Reviewed the audiogram with the patient in the office today.  Discussed with him concerning limited treatment for tinnitus.  Reviewed with him concerning using masking noise to help with control the tinnitus. Would also recommend consideration of use of hearing aids.  Radene Journey, MD

## 2019-09-02 ENCOUNTER — Encounter (INDEPENDENT_AMBULATORY_CARE_PROVIDER_SITE_OTHER): Payer: Self-pay

## 2019-11-08 ENCOUNTER — Other Ambulatory Visit: Payer: Self-pay | Admitting: Sports Medicine

## 2020-12-04 IMAGING — DX DG KNEE COMPLETE 4+V*R*
4 series · 4 of 4 positions shown · non-contrast
Comparison: None.

CLINICAL DATA: Right knee pain

EXAM:
RIGHT KNEE - COMPLETE 4+ VIEW

[knee ap]
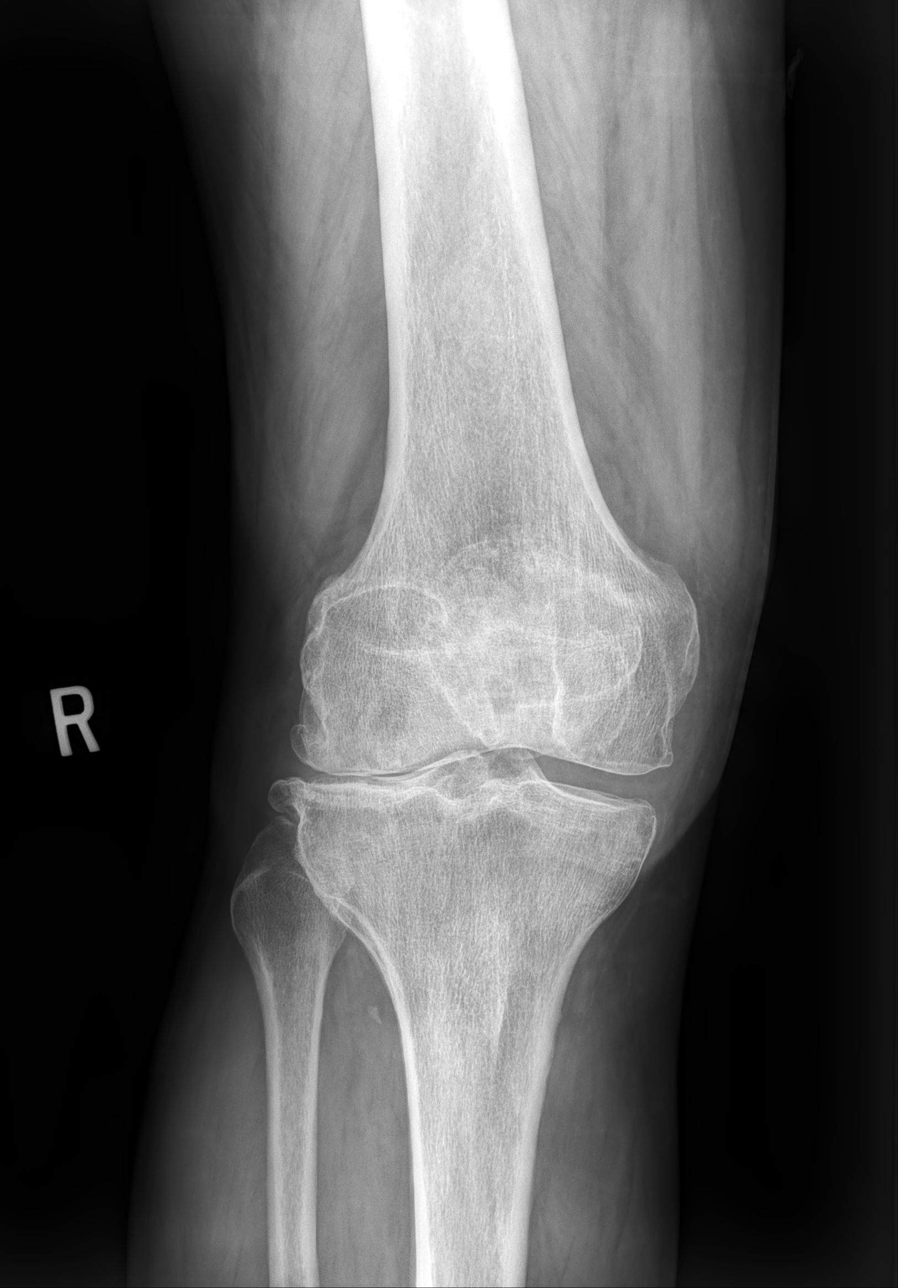

[knee mlo (1 of 2)]
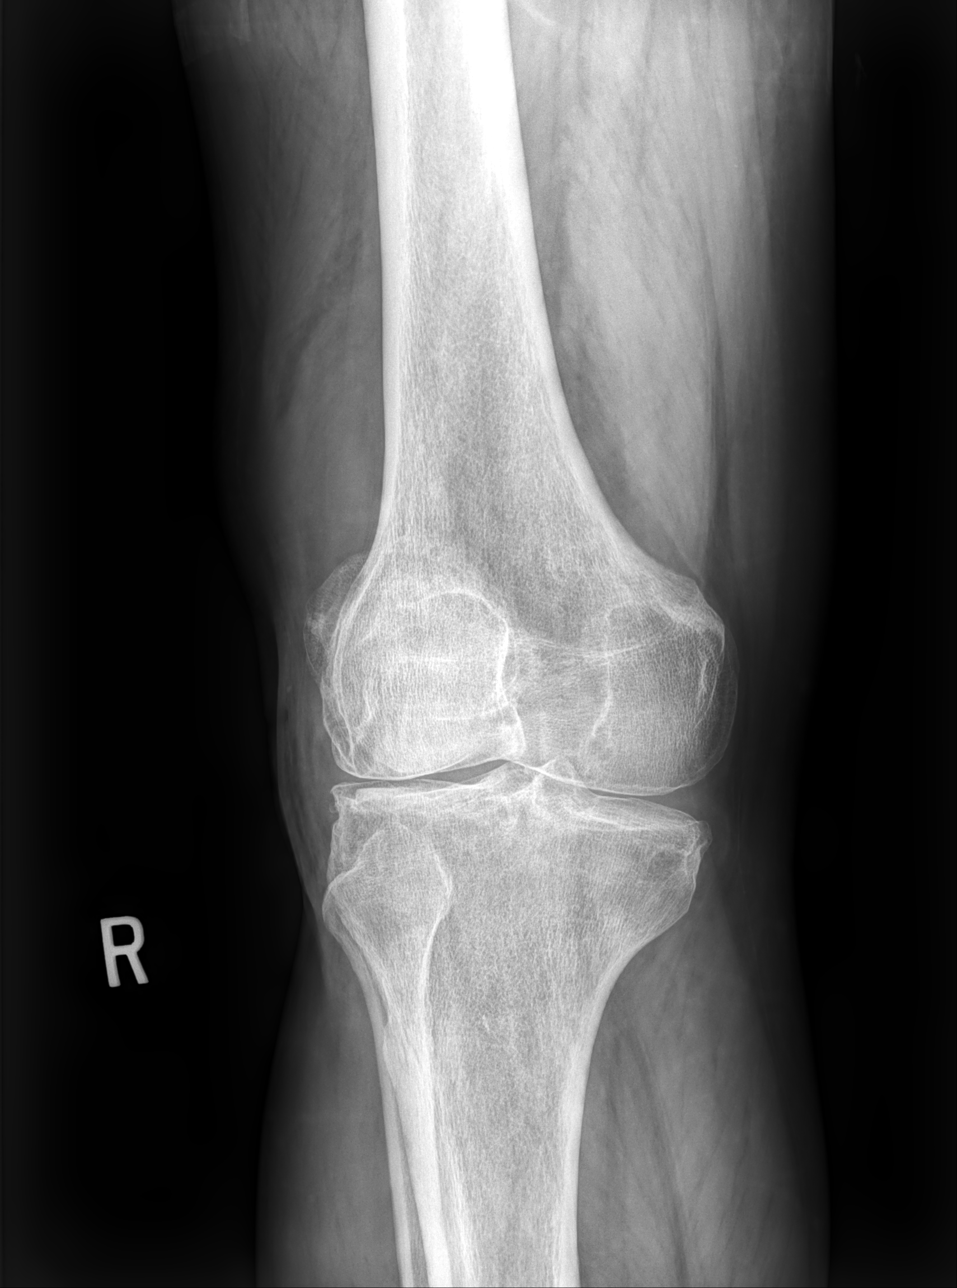

[knee mlo (2 of 2)]
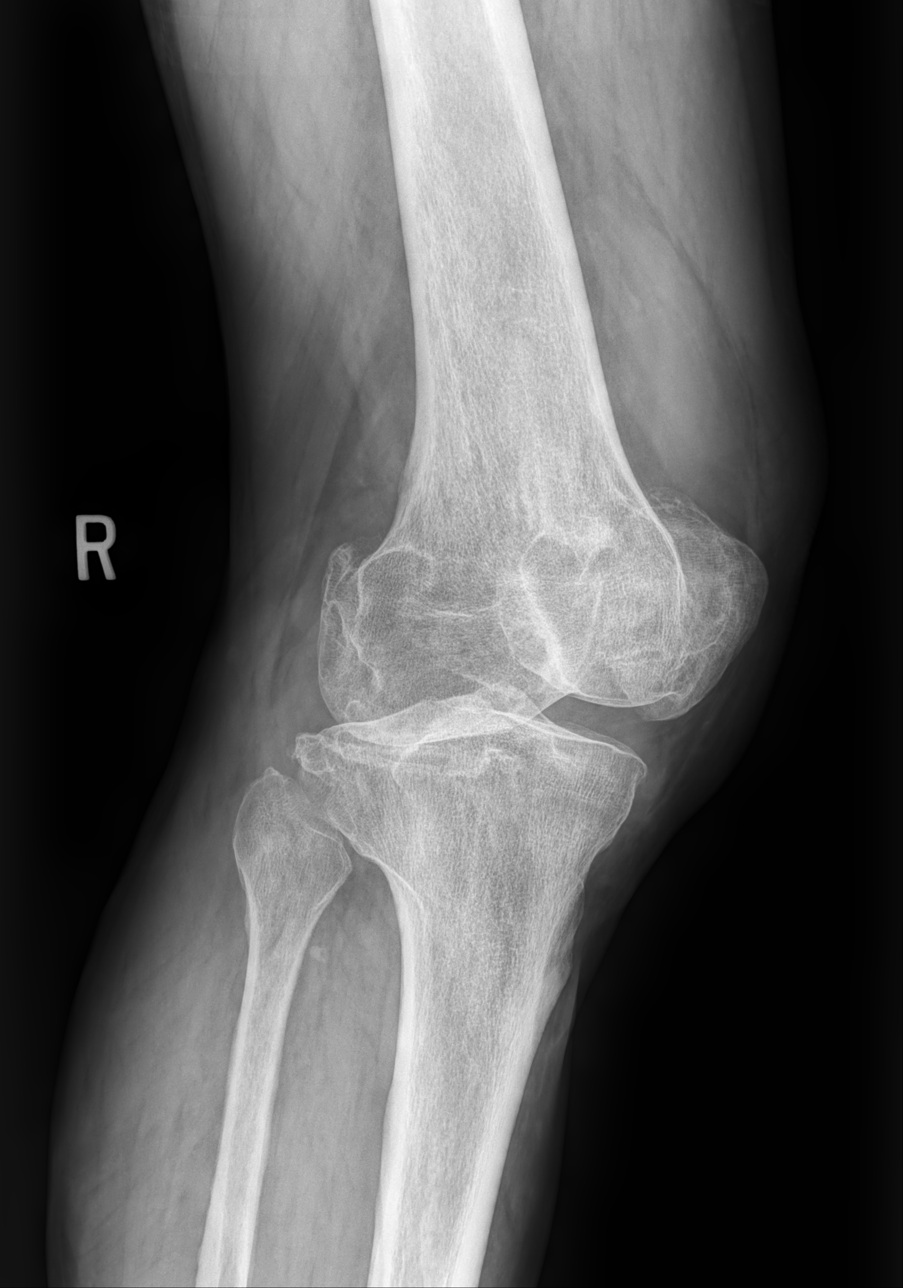

[knee lat]
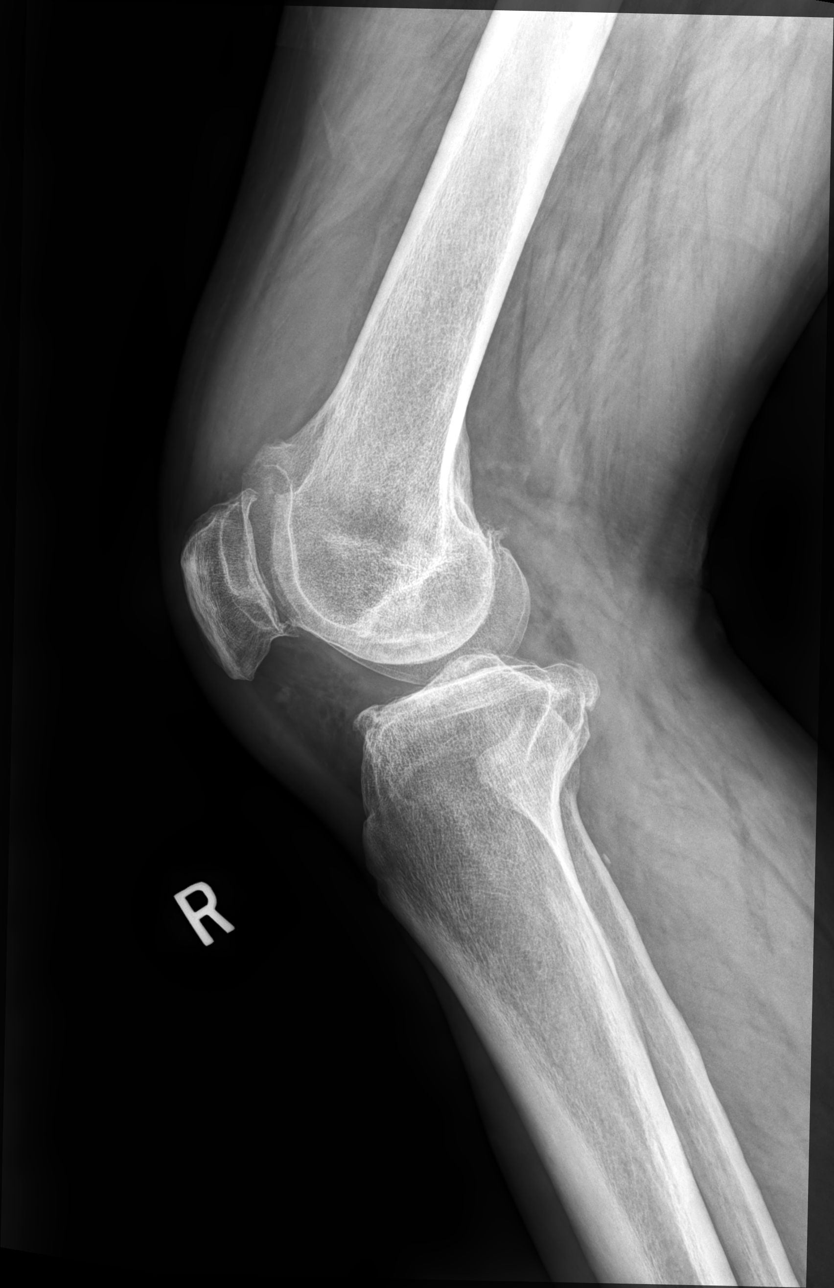

[4 of 4 positions shown; findings below may reference images not displayed]

FINDINGS: Generalized osteopenia. No acute fracture or dislocation. No
aggressive osseous lesion. Moderate lateral femorotibial compartment
with marginal osteophytes. Tiny medial femorotibial compartment
marginal osteophytes. Moderate patellofemoral compartment joint
space narrowing with marginal osteophytes. Large joint effusion. No
soft tissue abnormality.
IMPRESSION: 1.  No acute osseous injury of the right knee.
2. Tricompartmental osteoarthritis of the right knee most severe in
the lateral femorotibial compartment.

## 2021-02-13 ENCOUNTER — Other Ambulatory Visit: Payer: Self-pay

## 2021-02-13 ENCOUNTER — Emergency Department (HOSPITAL_BASED_OUTPATIENT_CLINIC_OR_DEPARTMENT_OTHER)
Admission: EM | Admit: 2021-02-13 | Discharge: 2021-02-13 | Disposition: A | Discharge status: Home / Self Care | Payer: Medicare PPO | Attending: Emergency Medicine | Admitting: Emergency Medicine

## 2021-02-13 ENCOUNTER — Encounter (HOSPITAL_BASED_OUTPATIENT_CLINIC_OR_DEPARTMENT_OTHER): Payer: Self-pay | Admitting: *Deleted

## 2021-02-13 DIAGNOSIS — R109 Unspecified abdominal pain: Secondary | ICD-10-CM | POA: Insufficient documentation

## 2021-02-13 DIAGNOSIS — A09 Infectious gastroenteritis and colitis, unspecified: Secondary | ICD-10-CM | POA: Insufficient documentation

## 2021-02-13 DIAGNOSIS — M7918 Myalgia, other site: Secondary | ICD-10-CM | POA: Insufficient documentation

## 2021-02-13 DIAGNOSIS — I10 Essential (primary) hypertension: Secondary | ICD-10-CM | POA: Diagnosis not present

## 2021-02-13 DIAGNOSIS — R112 Nausea with vomiting, unspecified: Secondary | ICD-10-CM | POA: Diagnosis present

## 2021-02-13 DIAGNOSIS — Z79899 Other long term (current) drug therapy: Secondary | ICD-10-CM | POA: Insufficient documentation

## 2021-02-13 DIAGNOSIS — Z20822 Contact with and (suspected) exposure to covid-19: Secondary | ICD-10-CM | POA: Insufficient documentation

## 2021-02-13 LAB — CBC
HCT: 45.4 % (ref 39.0–52.0)
Hemoglobin: 16 g/dL (ref 13.0–17.0)
MCH: 31.8 pg (ref 26.0–34.0)
MCHC: 35.2 g/dL (ref 30.0–36.0)
MCV: 90.3 fL (ref 80.0–100.0)
Platelets: 226 10*3/uL (ref 150–400)
RBC: 5.03 MIL/uL (ref 4.22–5.81)
RDW: 11.9 % (ref 11.5–15.5)
WBC: 10.2 10*3/uL (ref 4.0–10.5)
nRBC: 0 % (ref 0.0–0.2)

## 2021-02-13 LAB — URINALYSIS, ROUTINE W REFLEX MICROSCOPIC
Bilirubin Urine: NEGATIVE
Glucose, UA: >1000 mg/dL — AB
Hgb urine dipstick: NEGATIVE
Ketones, ur: 15 mg/dL — AB
Leukocytes,Ua: NEGATIVE
Nitrite: NEGATIVE
Protein, ur: 30 mg/dL — AB
Specific Gravity, Urine: 1.032 — ABNORMAL HIGH (ref 1.005–1.030)
pH: 5 (ref 5.0–8.0)

## 2021-02-13 LAB — COMPREHENSIVE METABOLIC PANEL
ALT: 20 U/L (ref 0–44)
AST: 10 U/L — ABNORMAL LOW (ref 15–41)
Albumin: 3.8 g/dL (ref 3.5–5.0)
Alkaline Phosphatase: 53 U/L (ref 38–126)
Anion gap: 10 (ref 5–15)
BUN: 20 mg/dL (ref 8–23)
CO2: 25 mmol/L (ref 22–32)
Calcium: 8.9 mg/dL (ref 8.9–10.3)
Chloride: 97 mmol/L — ABNORMAL LOW (ref 98–111)
Creatinine, Ser: 0.95 mg/dL (ref 0.61–1.24)
GFR, Estimated: >60 mL/min (ref >60)
Glucose, Bld: 262 mg/dL — ABNORMAL HIGH (ref 70–99)
Potassium: 3.7 mmol/L (ref 3.5–5.1)
Sodium: 132 mmol/L — ABNORMAL LOW (ref 135–145)
Total Bilirubin: 0.9 mg/dL (ref 0.3–1.2)
Total Protein: 6.6 g/dL (ref 6.5–8.1)

## 2021-02-13 LAB — AMMONIA: Ammonia: 25 umol/L (ref 9–35)

## 2021-02-13 LAB — RESP PANEL BY RT-PCR (FLU A&B, COVID) ARPGX2
Influenza A by PCR: NEGATIVE
Influenza B by PCR: NEGATIVE
SARS Coronavirus 2 by RT PCR: NEGATIVE

## 2021-02-13 LAB — LIPASE, BLOOD: Lipase: <10 U/L — ABNORMAL LOW (ref 11–51)

## 2021-02-13 LAB — CK: Total CK: 56 U/L (ref 49–397)

## 2021-02-13 MED ORDER — ONDANSETRON HCL 4 MG PO TABS: 4.0000 mg | ORAL_TABLET | Freq: Three times a day (TID) | ORAL | 0 refills | Status: AC | PRN

## 2021-02-13 MED ORDER — SODIUM CHLORIDE 0.9 % IV BOLUS
1000.0000 mL | Freq: Once | INTRAVENOUS | Status: AC
  Administered 2021-02-13: 1000 mL via INTRAVENOUS

## 2021-02-13 MED ORDER — ONDANSETRON HCL 4 MG/2ML IJ SOLN
4.0000 mg | Freq: Once | INTRAMUSCULAR | Status: AC
  Administered 2021-02-13: 4 mg via INTRAVENOUS
  Filled 2021-02-13: qty 2

## 2021-02-13 MED ORDER — CIPROFLOXACIN HCL 500 MG PO TABS: 500.0000 mg | ORAL_TABLET | Freq: Two times a day (BID) | ORAL | 0 refills | Status: AC

## 2021-02-13 NOTE — Discharge Instructions (Signed)
Your history, exam, work-up today are consistent with a likely gastroenteritis causing nausea, vomiting, and diarrhea.  Your work-up did show some dehydration but was otherwise reassuring.  As you have the same symptoms as your spouse and both recently traveled I am concerned about a travelers gastroenteritis.  As discussed, we will give you prescription for antibiotics with the diarrhea as well as nausea medicine to help maintain hydration.  Please rest and stay hydrated.  If any symptoms change or worsen, please return to the nearest emergency department.

## 2021-02-13 NOTE — ED Notes (Signed)
Patient verbalizes understanding of discharge instructions. Opportunity for questioning and answers were provided. Patient discharged from ED.  °

## 2021-02-13 NOTE — ED Notes (Signed)
Pt given diet ginger ale to drink  

## 2021-02-13 NOTE — ED Triage Notes (Signed)
Wednesday night diarrhea started, on mission trip in [place], Vomited yesterday, when he eats or drinks he throws up, leg cramps for a few days.

## 2021-02-13 NOTE — ED Provider Notes (Signed)
[facility] Provider Note   CSN: [id number] Arrival date & time: 02/13/21  0800     History Chief Complaint  Patient presents with   Abdominal Pain   Diarrhea   Emesis   Leg cramps    Bayden Gil is a 70 y.o. male.  The history is provided by the patient, medical records and the spouse. No language interpreter was used.  Abdominal Pain Context: not alcohol use   Associated symptoms: diarrhea, fatigue, nausea and vomiting   Associated symptoms: no chills, no cough and no fever   Diarrhea Quality:  Explosive Severity:  Mild Associated symptoms: myalgias and vomiting   Associated symptoms: no abdominal pain, no chills, no recent cough, no diaphoresis, no fever, no headaches and no URI   Risk factors: sick contacts, suspect food intake and travel to endemic area ([place])   Emesis Severity:  Severe Duration:  6 days Timing:  Constant Quality:  Stomach contents Progression:  Unchanged Chronicity:  New Recent urination:  Normal Relieved by:  Nothing Worsened by:  Nothing Associated symptoms: diarrhea and myalgias   Associated symptoms: no abdominal pain, no chills, no cough, no fever, no headaches and no URI       Past Medical History:  Diagnosis Date   Arthritis    knee   Gout    Hypertension     Patient Active Problem List   Diagnosis Date Noted   Constipation 03/14/2016   History of colonic polyps 03/14/2016   Gout flare 06/02/2015   Olecranon bursitis 03/26/2014   Osteoarthritis of right knee 07/29/2012   Left hip pain 04/24/2012   Femoral nerve injury 04/24/2012    Past Surgical History:  Procedure Laterality Date   COLONOSCOPY  2004   LBGI: 3 mm hepatic flexure polyp, hyperplastic. Diverticulosis    COLONOSCOPY N/A 04/13/2016   Dr. Gala Romney: multiple tubular adenomas, colonic diverticulosis   KNEE SURGERY     POLYPECTOMY  04/13/2016   Procedure: POLYPECTOMY;  Surgeon: Daneil Dolin, MD;  Location: [facility];  Service:  Endoscopy;;  sigmoidpolypectomies x2; cecal polypectomiees;       Family History  Problem Relation Age of Onset   Healthy Mother    Healthy Father    Colon cancer Neg Hx    Colon polyps Neg Hx     Social History   Tobacco Use   Smoking status: Never   Smokeless tobacco: Never  Vaping Use   Vaping Use: Never used  Substance Use Topics   Alcohol use: Yes    Comment: glass of wine a week    Drug use: No    Home Medications Prior to Admission medications   Medication Sig Start Date End Date Taking? Authorizing Provider  ibuprofen (ADVIL,MOTRIN) 200 MG tablet Take 400-600 mg by mouth daily as needed for headache or moderate pain.   Yes [provider]  lisinopril (ZESTRIL) 5 MG tablet Take 1 tablet (5 mg total) by mouth daily. 05/09/19  Yes Avegno, Darrelyn Hillock, FNP  Melatonin 3 MG CAPS Take 3 mg by mouth daily as needed (sleep).   Yes [provider]  polyethylene glycol (MIRALAX / GLYCOLAX) packet Take 17 g by mouth daily as needed for mild constipation.   Yes [provider]  diclofenac (VOLTAREN) 75 MG EC tablet TAKE 1 TABLET BY MOUTH TWICE A DAY 10/14/18   Stefanie Libel, MD  mupirocin ointment (BACTROBAN) 2 % Apply 1 application topically 2 (two) times daily. 01/20/19   [place], [place],  PA-C  Polyethyl Glycol-Propyl Glycol (SYSTANE OP) Apply 1 drop to eye daily as needed (dry eyes).    [provider]  tadalafil (CIALIS) 20 MG tablet Take 20 mg by mouth daily as needed. 09/10/15   [provider]    Allergies    Patient has no known allergies.  Review of Systems   Review of Systems  Constitutional:  Positive for fatigue. Negative for chills, diaphoresis and fever.  HENT:  Negative for congestion.   Respiratory:  Negative for cough, chest tightness and wheezing.   Gastrointestinal:  Positive for diarrhea, nausea and vomiting. Negative for abdominal pain.  Genitourinary:  Negative for flank pain.  Musculoskeletal:  Positive for  myalgias. Negative for back pain.  Skin:  Negative for rash and wound.  Neurological:  Negative for headaches.  Psychiatric/Behavioral:  Negative for agitation and confusion (mild but resolved).   All other systems reviewed and are negative.  Physical Exam Updated Vital Signs BP 107/75 (BP Location: Right Arm)   Pulse 94   Temp 98.5 F (36.9 C)   Resp 16   Ht 5\' 11"  (1.803 m)   Wt 98 kg   SpO2 97%   BMI 30.13 kg/m   Physical Exam Vitals and nursing note reviewed.  Constitutional:      General: He is not in acute distress.    Appearance: He is well-developed. He is not ill-appearing, toxic-appearing or diaphoretic.  HENT:     Head: Normocephalic and atraumatic.     Mouth/Throat:     Mouth: Mucous membranes are moist.  Eyes:     Conjunctiva/sclera: Conjunctivae normal.  Cardiovascular:     Rate and Rhythm: Normal rate and regular rhythm.     Heart sounds: No murmur heard. Pulmonary:     Effort: Pulmonary effort is normal. No respiratory distress.     Breath sounds: Normal breath sounds. No wheezing, rhonchi or rales.  Chest:     Chest wall: No tenderness.  Abdominal:     General: Abdomen is flat. Bowel sounds are normal. There is no distension.     Palpations: Abdomen is soft.     Tenderness: There is no abdominal tenderness. There is no right CVA tenderness, left CVA tenderness, guarding or rebound.  Musculoskeletal:     Cervical back: Neck supple.  Skin:    General: Skin is warm and dry.     Capillary Refill: Capillary refill takes less than 2 seconds.     Coloration: Skin is not pale.     Findings: No rash.  Neurological:     General: No focal deficit present.     Mental Status: He is alert.  Psychiatric:        Mood and Affect: Mood normal.    ED Results / Procedures / Treatments   Labs (all labs ordered are listed, but only abnormal results are displayed) Labs Reviewed  LIPASE, BLOOD - Abnormal; Notable for the following components:      Result Value    Lipase <10 (*)    All other components within normal limits  COMPREHENSIVE METABOLIC PANEL - Abnormal; Notable for the following components:   Sodium 132 (*)    Chloride 97 (*)    Glucose, Bld 262 (*)    AST 10 (*)    All other components within normal limits  URINALYSIS, ROUTINE W REFLEX MICROSCOPIC - Abnormal; Notable for the following components:   Specific Gravity, Urine 1.032 (*)    Glucose, UA >1,000 (*)  Ketones, ur 15 (*)    Protein, ur 30 (*)    Bacteria, UA FEW (*)    All other components within normal limits  RESP PANEL BY RT-PCR (FLU A&B, COVID) ARPGX2  CBC  AMMONIA  CK    EKG None  Radiology No results found.  Procedures Procedures   Medications Ordered in ED Medications  ondansetron (ZOFRAN) injection 4 mg (4 mg Intravenous Given 02/13/21 1033)  sodium chloride 0.9 % bolus 1,000 mL (0 mLs Intravenous Stopped 02/13/21 1514)    ED Course  I have reviewed the triage vital signs and the nursing notes.  Pertinent labs & imaging results that were available during my care of the patient were reviewed by me and considered in my medical decision making (see chart for details).    MDM Rules/Calculators/A&P                           Tevita Gomer is a 70 y.o. male with a past medical history significant for hypertension, gout, and arthritis who presents with nausea, vomiting, diarrhea, and myalgias after travel.  According to patient and spouse who is also a patient today, they had a suspicious meal while in [place] 6 days ago and has had nausea and vomiting ever since.  The wife has had near constant diarrhea but patient has only had a small mild diarrhea and more vomiting.  Others on their trip also had similar symptoms after eating the same meal.  The patient has had some mild abdominal cramping that has totally resolved but is still having nausea and vomiting.  Otherwise he has had some muscle cramping but no focal leg swelling or calf pain.  No history of DVT or  PE.  He denies any chest pain, shortness of breath, or constipation.  No urinary symptoms other than being slightly darker with urine.  Based on multiple people having similar symptoms I do suspect he has a viral versus travelers gastroenteritis.  As his abdomen is completely nontender, we agreed to hold on imaging initially but we would give some screening labs and give him some nausea medicine and fluids.  On exam, abdomen was nontender and lungs were clear.  Chest was nontender.  No focal neurologic deficits.  He was not confused as his family thought he was acting slightly confused the other day.  Suspect mild dehydration leading to some some mild confusion.  Leg was completely nontender, low suspicion for DVT or PE.  Clinically I do suspect he has a travelers gastroenteritis.  The spouse was able to provide a stool sample for pathogen panel and culture but we will treat the patient now with a prescription for Cipro for traveler's diarrhea and some nausea medicine.  Patient agrees and they will both follow-up with PCP in several days.  Patient felt better after fluids and overall lab testing reassuring.  Patient with questions or concerns and was discharged in good condition with understanding return precautions   Final Clinical Impression(s) / ED Diagnoses Final diagnoses:  Nausea and vomiting, unspecified vomiting type  Traveler's diarrhea    Rx / DC Orders ED Discharge Orders          Ordered    ciprofloxacin (CIPRO) 500 MG tablet  Every 12 hours        02/13/21 1508    ondansetron (ZOFRAN) 4 MG tablet  Every 8 hours PRN        02/13/21 1508  Clinical Impression: 1. Nausea and vomiting, unspecified vomiting type   2. Traveler's diarrhea     Disposition: Discharge  Condition: Good  I have discussed the results, Dx and Tx plan with the pt(& family if present). He/she/they expressed understanding and agree(s) with the plan. Discharge instructions discussed at great  length. Strict return precautions discussed and pt &/or family have verbalized understanding of the instructions. No further questions at time of discharge.    Discharge Medication List as of 02/13/2021  3:08 PM     START taking these medications   Details  ciprofloxacin (CIPRO) 500 MG tablet Take 1 tablet (500 mg total) by mouth every 12 (twelve) hours for 5 days., Starting Mon 02/13/2021, Until Sat 02/18/2021, Print    ondansetron (ZOFRAN) 4 MG tablet Take 1 tablet (4 mg total) by mouth every 8 (eight) hours as needed for nausea or vomiting., Starting Mon 02/13/2021, Print        Follow Up: Chesley Noon, MD [place] [id number] [phone]     [facility] [place] [id number] [phone]       Dejia Ebron, Gwenyth Allegra, MD 02/13/21 1531

## 2021-04-18 ENCOUNTER — Other Ambulatory Visit: Payer: Self-pay

## 2021-04-18 ENCOUNTER — Emergency Department
Admission: RE | Admit: 2021-04-18 | Discharge: 2021-04-18 | Disposition: A | Discharge status: Home / Self Care | Payer: Medicare PPO | Source: Ambulatory Visit

## 2021-04-18 VITALS — BP 94/59 | HR 82 | Temp 98.2°F | Resp 16

## 2021-04-18 DIAGNOSIS — J0100 Acute maxillary sinusitis, unspecified: Secondary | ICD-10-CM

## 2021-04-18 DIAGNOSIS — J3489 Other specified disorders of nose and nasal sinuses: Secondary | ICD-10-CM | POA: Diagnosis not present

## 2021-04-18 MED ORDER — PREDNISONE 20 MG PO TABS
ORAL_TABLET | ORAL | 0 refills | Status: AC
Start: 2021-04-18 — End: ?

## 2021-04-18 MED ORDER — AMOXICILLIN-POT CLAVULANATE 875-125 MG PO TABS: 1.0000 | ORAL_TABLET | Freq: Two times a day (BID) | ORAL | 0 refills | Status: AC

## 2021-04-18 NOTE — Discharge Instructions (Addendum)
Advised patient to take medication as directed with food to completion.  Advised patient to take Prednisone with first dose of Augmentin for the next 5 of 10 days.  Encouraged patient to increase daily water intake while taking these medications.

## 2021-04-18 NOTE — ED Triage Notes (Signed)
.  Patient presents to Urgent Care with complaints of cough, facial pressure x 11 days. Treating symptoms with sudafed with some relief.    Denies fever.

## 2021-04-18 NOTE — ED Provider Notes (Signed)
[facility]    CSN: [id number] Arrival date & time: 04/18/21  1224      History   Chief Complaint Chief Complaint  Patient presents with   Appointment    1300   Cough    HPI Timothy Moyer is a 71 y.o. male.   HPI 71 year old male presents with cough and facial pressure for 11 days.  Patient reports using OTC Sudafed with some relief.  Denies fever.  PMH significant for HTN.  Past Medical History:  Diagnosis Date   Arthritis    knee   Gout    Hypertension     Patient Active Problem List   Diagnosis Date Noted   Constipation 03/14/2016   History of colonic polyps 03/14/2016   Gout flare 06/02/2015   Olecranon bursitis 03/26/2014   Osteoarthritis of right knee 07/29/2012   Left hip pain 04/24/2012   Femoral nerve injury 04/24/2012    Past Surgical History:  Procedure Laterality Date   COLONOSCOPY  2004   LBGI: 3 mm hepatic flexure polyp, hyperplastic. Diverticulosis    COLONOSCOPY N/A 04/13/2016   Dr. Gala Romney: multiple tubular adenomas, colonic diverticulosis   KNEE SURGERY     POLYPECTOMY  04/13/2016   Procedure: POLYPECTOMY;  Surgeon: Daneil Dolin, MD;  Location: [facility];  Service: Endoscopy;;  sigmoidpolypectomies x2; cecal polypectomiees;       Home Medications    Prior to Admission medications   Medication Sig Start Date End Date Taking? Authorizing Provider  amoxicillin-clavulanate (AUGMENTIN) 875-125 MG tablet Take 1 tablet by mouth every 12 (twelve) hours for 10 days. 04/18/21 04/28/21 Yes Eliezer Lofts, FNP  predniSONE (DELTASONE) 20 MG tablet Take 3 tabs PO daily x 5 days. 04/18/21  Yes Eliezer Lofts, FNP  diclofenac (VOLTAREN) 75 MG EC tablet TAKE 1 TABLET BY MOUTH TWICE A DAY 10/14/18   Stefanie Libel, MD  ibuprofen (ADVIL,MOTRIN) 200 MG tablet Take 400-600 mg by mouth daily as needed for headache or moderate pain.    [provider]  lisinopril (ZESTRIL) 5 MG tablet Take 1 tablet (5 mg total) by mouth daily. 05/09/19    Avegno, Darrelyn Hillock, FNP  Melatonin 3 MG CAPS Take 3 mg by mouth daily as needed (sleep).    [provider]  mupirocin ointment (BACTROBAN) 2 % Apply 1 application topically 2 (two) times daily. 01/20/19   [place], [place], PA-C  ondansetron (ZOFRAN) 4 MG tablet Take 1 tablet (4 mg total) by mouth every 8 (eight) hours as needed for nausea or vomiting. 02/13/21   [place], Gwenyth Allegra, MD  Polyethyl Glycol-Propyl Glycol (SYSTANE OP) Apply 1 drop to eye daily as needed (dry eyes).    [provider]  polyethylene glycol (MIRALAX / GLYCOLAX) packet Take 17 g by mouth daily as needed for mild constipation.    [provider]  tadalafil (CIALIS) 20 MG tablet Take 20 mg by mouth daily as needed. 09/10/15   [provider]    Family History Family History  Problem Relation Age of Onset   Healthy Mother    Healthy Father    Colon cancer Neg Hx    Colon polyps Neg Hx     Social History Social History   Tobacco Use   Smoking status: Never   Smokeless tobacco: Never  Vaping Use   Vaping Use: Never used  Substance Use Topics   Alcohol use: Yes    Comment: glass of wine a week    Drug use: No  Allergies   Patient has no known allergies.   Review of Systems Review of Systems  HENT:  Positive for sinus pressure.   Respiratory:  Positive for cough.   All other systems reviewed and are negative.   Physical Exam Triage Vital Signs ED Triage Vitals  Enc Vitals Group     BP 04/18/21 1244 (!) 94/59     Pulse Rate 04/18/21 1244 82     Resp 04/18/21 1244 16     Temp 04/18/21 1244 98.2 F (36.8 C)     Temp Source 04/18/21 1244 Oral     SpO2 04/18/21 1244 95 %     Weight --      Height --      Head Circumference --      Peak Flow --      Pain Score 04/18/21 1241 0     Pain Loc --      Pain Edu? --      Excl. in [place]? --    No data found.  Updated Vital Signs BP (!) 94/59 (BP Location: Left Arm)    Pulse 82    Temp 98.2 F (36.8 C) (Oral)     Resp 16    SpO2 95%      Physical Exam Vitals and nursing note reviewed.  Constitutional:      General: He is not in acute distress.    Appearance: Normal appearance. He is normal weight. He is not ill-appearing.  HENT:     Right Ear: Tympanic membrane and external ear normal.     Left Ear: Tympanic membrane and external ear normal.     Ears:     Comments: Moderate eustachian tube dysfunction noted bilaterally    Nose:     Right Sinus: Maxillary sinus tenderness present.     Left Sinus: Maxillary sinus tenderness present.     Comments: Turbinates are erythematous/edematous    Mouth/Throat:     Mouth: Mucous membranes are moist.     Pharynx: Oropharynx is clear. Uvula midline.  Eyes:     Extraocular Movements: Extraocular movements intact.     Conjunctiva/sclera: Conjunctivae normal.     Pupils: Pupils are equal, round, and reactive to light.  Cardiovascular:     Rate and Rhythm: Normal rate and regular rhythm.     Pulses: Normal pulses.     Heart sounds: Normal heart sounds.  Pulmonary:     Effort: Pulmonary effort is normal.     Breath sounds: Normal breath sounds.  Musculoskeletal:     Cervical back: Normal range of motion and neck supple.  Skin:    General: Skin is warm and dry.  Neurological:     General: No focal deficit present.     Mental Status: He is alert and oriented to person, place, and time. Mental status is at baseline.     UC Treatments / Results  Labs (all labs ordered are listed, but only abnormal results are displayed) Labs Reviewed - No data to display  EKG   Radiology No results found.  Procedures Procedures (including critical care time)  Medications Ordered in UC Medications - No data to display  Initial Impression / Assessment and Plan / UC Course  I have reviewed the triage vital signs and the nursing notes.  Pertinent labs & imaging results that were available during my care of the patient were reviewed by me and considered in  my medical decision making (see chart for details).  MDM: 1.  Acute maxillary sinusitis, recurrence not specified-Rx'd Augmentin; 2.  Sinus pressure-Rx'd Prednisone. Advised patient to take medication as directed with food to completion.  Advised patient to take Prednisone with first dose of Augmentin for the next 5 of 10 days.  Encouraged patient to increase daily water intake while taking these medications.  Patient discharged home, hemodynamically stable. Final Clinical Impressions(s) / UC Diagnoses   Final diagnoses:  Acute maxillary sinusitis, recurrence not specified  Sinus pressure     Discharge Instructions      Advised patient to take medication as directed with food to completion.  Advised patient to take Prednisone with first dose of Augmentin for the next 5 of 10 days.  Encouraged patient to increase daily water intake while taking these medications.     ED Prescriptions     Medication Sig Dispense Auth. Provider   amoxicillin-clavulanate (AUGMENTIN) 875-125 MG tablet Take 1 tablet by mouth every 12 (twelve) hours for 10 days. 20 tablet Eliezer Lofts, FNP   predniSONE (DELTASONE) 20 MG tablet Take 3 tabs PO daily x 5 days. 15 tablet Eliezer Lofts, FNP      PDMP not reviewed this encounter.   Eliezer Lofts, FNP 04/18/21 1423

## 2021-04-25 ENCOUNTER — Encounter: Payer: Self-pay | Admitting: *Deleted

## 2023-10-29 ENCOUNTER — Ambulatory Visit: Admitting: Sports Medicine

## 2023-11-07 ENCOUNTER — Ambulatory Visit: Admitting: Sports Medicine

## 2023-11-07 VITALS — BP 124/80 | Ht 71.0 in | Wt 205.0 lb

## 2023-11-07 DIAGNOSIS — M25552 Pain in left hip: Secondary | ICD-10-CM | POA: Diagnosis not present

## 2023-11-07 NOTE — Progress Notes (Signed)
 PCP: Sophronia Ozell BROCKS, MD  Subjective:   HPI: Patient is a 73 y.o. male here for left anterior hip/groin pain as well as right posterior lateral hip pain.  Pain began 3 weeks ago, most notably along the left anterior hip and groin.  Worse with ambulation and activities requiring external rotation of the left leg.  Occasionally he would also experience pain in the posterior lateral right hip while he was having pain along the left hip.  His previous medical history is significant for a left femoral nerve neuroma in 2014.  Pain was in nearly the same location in the left anterior hip and groin at that time but was described as shooting pain.  His most recent pain has not been shooting in nature but has understandably caused him concern.  He tried taking diclofenac  1 week ago which has eliminated right posterior lateral hip pain and has significantly improved the pain in his left hip.  He remains quite active, playing pickle ball 3 times weekly.  Past Medical History:  Diagnosis Date   Arthritis    knee   Gout    Hypertension     Current Outpatient Medications on File Prior to Visit  Medication Sig Dispense Refill   diclofenac  (VOLTAREN ) 75 MG EC tablet TAKE 1 TABLET BY MOUTH TWICE A DAY 60 tablet 1   ibuprofen (ADVIL,MOTRIN) 200 MG tablet Take 400-600 mg by mouth daily as needed for headache or moderate pain.     lisinopril  (ZESTRIL ) 5 MG tablet Take 1 tablet (5 mg total) by mouth daily. 90 tablet 0   Melatonin 3 MG CAPS Take 3 mg by mouth daily as needed (sleep).     mupirocin  ointment (BACTROBAN ) 2 % Apply 1 application topically 2 (two) times daily. 30 g 2   ondansetron  (ZOFRAN ) 4 MG tablet Take 1 tablet (4 mg total) by mouth every 8 (eight) hours as needed for nausea or vomiting. 12 tablet 0   Polyethyl Glycol-Propyl Glycol (SYSTANE OP) Apply 1 drop to eye daily as needed (dry eyes).     polyethylene glycol (MIRALAX / GLYCOLAX) packet Take 17 g by mouth daily as needed for mild  constipation.     predniSONE  (DELTASONE ) 20 MG tablet Take 3 tabs PO daily x 5 days. 15 tablet 0   tadalafil (CIALIS) 20 MG tablet Take 20 mg by mouth daily as needed.     No current facility-administered medications on file prior to visit.    Past Surgical History:  Procedure Laterality Date   COLONOSCOPY  2004   LBGI: 3 mm hepatic flexure polyp, hyperplastic. Diverticulosis    COLONOSCOPY N/A 04/13/2016   Dr. Shaaron: multiple tubular adenomas, colonic diverticulosis   KNEE SURGERY     POLYPECTOMY  04/13/2016   Procedure: POLYPECTOMY;  Surgeon: Lamar CHRISTELLA Shaaron, MD;  Location: [facility];  Service: Endoscopy;;  sigmoidpolypectomies x2; cecal polypectomiees;    No Known Allergies  BP 124/80   Ht 5' 11 (1.803 m)   Wt 205 lb (93 kg)   BMI 28.59 kg/m       No data to display              No data to display              Objective:  Physical Exam:  Gen: NAD, comfortable in exam room  Bilateral Hips: No obvious deformity on inspection No tenderness palpation bilaterally Full range of motion bilaterally with flexion, abduction, and adduction 5/5 strength bilaterally Mild  anterior pain is elicited with flexion of the left hip Negative logroll, FABER, and FADIR bilaterally The lower extremities are neurovascularly intact   Assessment & Plan:  1.  Left hip pain 3-week history of left anterior hip pain and intermittent right posterior/lateral hip pain that has since resolved.  Pain does not have the same shooting quality that it did in 2014 when he injured the left femoral nerve.  He is a very active 73 year old, playing pickle ball 3 times weekly.  The range of motion of his left hip is adequate and not consistent with significant underlying osteoarthritis to explain his discomfort.  He has great strength of both hips as well.  He may have developed tendinitis or some degree of capsular arthropathy recently given the forceful, intense movements required to play pickle  ball.  He has responded well to diclofenac .  Recommend continuing diclofenac  daily for 2 weeks and then using as needed for pain relief.  We also recommend a home exercise program aimed at strengthening the muscles surrounding the hip.  He was given a hip abduction series today.  Recommend follow-up as needed if pain worsens or fail to improve at which time it would be reasonable to proceed with repeat ultrasound of the left hip given his history.
